# Patient Record
Sex: Female | Born: 2000 | Hispanic: No | Marital: Married | Smoking: Current every day smoker
Health system: Southern US, Community
[De-identification: ages and names within clinical notes are randomized; demographics above are authoritative.]

## PROBLEM LIST (undated history)

## (undated) DIAGNOSIS — F909 Attention-deficit hyperactivity disorder, unspecified type: Secondary | ICD-10-CM

## (undated) DIAGNOSIS — R569 Unspecified convulsions: Secondary | ICD-10-CM

## (undated) DIAGNOSIS — F319 Bipolar disorder, unspecified: Secondary | ICD-10-CM

---
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
## 2021-10-19 ENCOUNTER — Other Ambulatory Visit: Payer: Self-pay

## 2021-10-19 ENCOUNTER — Emergency Department (HOSPITAL_COMMUNITY)
Admission: EM | Admit: 2021-10-19 | Discharge: 2021-10-19 | Attending: Emergency Medicine | Admitting: Emergency Medicine

## 2021-10-19 ENCOUNTER — Emergency Department (HOSPITAL_COMMUNITY)

## 2021-10-19 ENCOUNTER — Encounter (HOSPITAL_COMMUNITY): Payer: Self-pay

## 2021-10-19 DIAGNOSIS — Z91040 Latex allergy status: Secondary | ICD-10-CM | POA: Insufficient documentation

## 2021-10-19 DIAGNOSIS — R569 Unspecified convulsions: Secondary | ICD-10-CM | POA: Diagnosis present

## 2021-10-19 DIAGNOSIS — Z720 Tobacco use: Secondary | ICD-10-CM | POA: Diagnosis not present

## 2021-10-19 LAB — CBC WITH DIFFERENTIAL/PLATELET
Abs Immature Granulocytes: 0.03 10*3/uL (ref 0.00–0.07)
Basophils Absolute: 0 10*3/uL (ref 0.0–0.1)
Basophils Relative: 0 %
Eosinophils Absolute: 0.1 10*3/uL (ref 0.0–0.5)
Eosinophils Relative: 1 %
HCT: 36.5 % (ref 36.0–46.0)
Hemoglobin: 12.1 g/dL (ref 12.0–15.0)
Immature Granulocytes: 0 %
Lymphocytes Relative: 17 %
Lymphs Abs: 1.4 10*3/uL (ref 0.7–4.0)
MCH: 28.9 pg (ref 26.0–34.0)
MCHC: 33.2 g/dL (ref 30.0–36.0)
MCV: 87.1 fL (ref 80.0–100.0)
Monocytes Absolute: 0.5 10*3/uL (ref 0.1–1.0)
Monocytes Relative: 6 %
Neutro Abs: 6.2 10*3/uL (ref 1.7–7.7)
Neutrophils Relative %: 76 %
Platelets: 329 10*3/uL (ref 150–400)
RBC: 4.19 MIL/uL (ref 3.87–5.11)
RDW: 14.3 % (ref 11.5–15.5)
WBC: 8.3 10*3/uL (ref 4.0–10.5)
nRBC: 0 % (ref 0.0–0.2)

## 2021-10-19 LAB — URINALYSIS, ROUTINE W REFLEX MICROSCOPIC
Bilirubin Urine: NEGATIVE
Glucose, UA: NEGATIVE mg/dL
Hgb urine dipstick: NEGATIVE
Ketones, ur: 5 mg/dL — AB
Nitrite: NEGATIVE
Protein, ur: NEGATIVE mg/dL
Specific Gravity, Urine: 1.006 (ref 1.005–1.030)
pH: 7 (ref 5.0–8.0)

## 2021-10-19 LAB — BASIC METABOLIC PANEL
Anion gap: 6 (ref 5–15)
BUN: 9 mg/dL (ref 6–20)
CO2: 24 mmol/L (ref 22–32)
Calcium: 9 mg/dL (ref 8.9–10.3)
Chloride: 106 mmol/L (ref 98–111)
Creatinine, Ser: 0.7 mg/dL (ref 0.44–1.00)
GFR, Estimated: >60 mL/min (ref >60)
Glucose, Bld: 88 mg/dL (ref 70–99)
Potassium: 3.7 mmol/L (ref 3.5–5.1)
Sodium: 136 mmol/L (ref 135–145)

## 2021-10-19 LAB — RAPID URINE DRUG SCREEN, HOSP PERFORMED
Amphetamines: NOT DETECTED
Barbiturates: NOT DETECTED
Benzodiazepines: POSITIVE — AB
Cocaine: NOT DETECTED
Opiates: NOT DETECTED
Tetrahydrocannabinol: NOT DETECTED

## 2021-10-19 LAB — ETHANOL: Alcohol, Ethyl (B): <10 mg/dL (ref <10)

## 2021-10-19 LAB — HCG, SERUM, QUALITATIVE: Preg, Serum: NEGATIVE

## 2021-10-19 LAB — SALICYLATE LEVEL: Salicylate Lvl: <7 mg/dL — ABNORMAL LOW (ref 7.0–30.0)

## 2021-10-19 LAB — ACETAMINOPHEN LEVEL: Acetaminophen (Tylenol), Serum: <10 ug/mL — ABNORMAL LOW (ref 10–30)

## 2021-10-19 MED ORDER — SODIUM CHLORIDE 0.9 % IV BOLUS
1000.0000 mL | Freq: Once | INTRAVENOUS | Status: AC
  Administered 2021-10-19: 1000 mL via INTRAVENOUS

## 2021-10-19 MED ORDER — LEVETIRACETAM IN NACL 1000 MG/100ML IV SOLN
1000.0000 mg | Freq: Once | INTRAVENOUS | Status: AC
  Administered 2021-10-19: 1000 mg via INTRAVENOUS
  Filled 2021-10-19: qty 100

## 2021-10-19 NOTE — ED Provider Notes (Signed)
[facility] EMERGENCY DEPARTMENT Provider Note   CSN: [id number] Arrival date & time: 10/19/21  1832     History  Chief Complaint  Patient presents with   Seizures    Brandi Sanchez is a 21 y.o. female history of juvenile myoclonic epilepsy, bipolar, tobacco use, marijuana use here for evaluation of seizure-like activity.  Patient brought in from jail where she has been incarcerated for quite some time.  States she had 2 seizures today.  She states she felt the first 1 "come on" and she sat herself on the ground.  States she has had urinary incontinence, did not bite her tongue.  Sudden onset thunderclap headache.  EMS witnessed seizure on their arrival lasted just under a minute.  Was given Versed with resolved.  She denies any recent head injury or trauma.  Has been feeling otherwise well.  No fever, headache, numbness, weakness, chest pain, shortness of breath, cough, abdominal pain.  Patient states she supposed to be taking 1000 mg Keppra 3 times daily however has only been given 500 since her incarceration.  She does however states has been many years since she has been evaluated by neurology so she is not quite sure what that she should be on.  She told myself as well as nursing staff different doses on when she gets while she is incarcerated.  Sheriff at bedside is unaware. No nursing notes from jail with meds listed.  Last seizure > 2 years ago  HPI     Home Medications Prior to Admission medications   Medication Sig Start Date End Date Taking? Authorizing Provider  cephALEXin (KEFLEX) 500 MG capsule Take 500 mg by mouth 2 (two) times daily. 10/13/21  Yes [provider]  GOODSENSE IBUPROFEN 200 MG tablet Take 200 mg by mouth. 10/13/21  Yes [provider]  levETIRAcetam (KEPPRA) 1000 MG tablet Take 1 tablet (1,000 mg total) by mouth every 12 (twelve) hours. 02/26/18  Yes Tat, Onalee Hua, MD  levETIRAcetam (KEPPRA) 750 MG tablet Take 750 mg by mouth 2 (two) times  daily. 10/13/21   [provider]      Allergies    Latex and Milk-related compounds    Review of Systems   Review of Systems  Constitutional: Negative.   HENT: Negative.    Respiratory: Negative.    Cardiovascular: Negative.   Gastrointestinal: Negative.   Genitourinary: Negative.   Musculoskeletal: Negative.   Skin: Negative.   Neurological: Negative.   All other systems reviewed and are negative.   Physical Exam Updated Vital Signs BP 109/71   Pulse 62   Temp 98.4 F (36.9 C)   Resp 20   Ht 5\' 2"  (1.575 m)   Wt 67 kg   SpO2 100%   BMI 27.02 kg/m  Physical Exam Vitals and nursing note reviewed.  Constitutional:      General: She is not in acute distress.    Appearance: She is well-developed. She is not ill-appearing, toxic-appearing or diaphoretic.  HENT:     Head: Normocephalic and atraumatic.     Nose: Nose normal.     Mouth/Throat:     Mouth: Mucous membranes are moist.  Eyes:     Pupils: Pupils are equal, round, and reactive to light.  Cardiovascular:     Rate and Rhythm: Normal rate.     Pulses: Normal pulses.     Heart sounds: Normal heart sounds.  Pulmonary:     Effort: Pulmonary effort is normal. No respiratory distress.  Breath sounds: Normal breath sounds.  Abdominal:     General: Bowel sounds are normal. There is no distension.     Palpations: Abdomen is soft.  Musculoskeletal:        General: No swelling, tenderness, deformity or signs of injury. Normal range of motion.     Cervical back: Normal range of motion.     Right lower leg: No edema.     Left lower leg: No edema.  Skin:    General: Skin is warm and dry.     Capillary Refill: Capillary refill takes less than 2 seconds.  Neurological:     General: No focal deficit present.     Mental Status: She is alert and oriented to person, place, and time.     Cranial Nerves: No cranial nerve deficit.     Sensory: No sensory deficit.     Motor: No weakness.     Coordination:  Coordination normal.     Gait: Gait normal.  Psychiatric:        Mood and Affect: Mood normal.     ED Results / Procedures / Treatments   Labs (all labs ordered are listed, but only abnormal results are displayed) Labs Reviewed  URINALYSIS, ROUTINE W REFLEX MICROSCOPIC - Abnormal; Notable for the following components:      Result Value   Color, Urine STRAW (*)    Ketones, ur 5 (*)    Leukocytes,Ua SMALL (*)    Bacteria, UA RARE (*)    All other components within normal limits  RAPID URINE DRUG SCREEN, HOSP PERFORMED - Abnormal; Notable for the following components:   Benzodiazepines POSITIVE (*)    All other components within normal limits  SALICYLATE LEVEL - Abnormal; Notable for the following components:   Salicylate Lvl <7.0 (*)    All other components within normal limits  ACETAMINOPHEN LEVEL - Abnormal; Notable for the following components:   Acetaminophen (Tylenol), Serum <10 (*)    All other components within normal limits  CBC WITH DIFFERENTIAL/PLATELET  BASIC METABOLIC PANEL  HCG, SERUM, QUALITATIVE  ETHANOL    EKG None  Radiology DG Chest Portable 1 View  Result Date: 10/19/2021 CLINICAL DATA:  Fall, seizure EXAM: PORTABLE CHEST 1 VIEW COMPARISON:  02/25/2018 FINDINGS: The heart size and mediastinal contours are within normal limits. Both lungs are clear. The visualized skeletal structures are unremarkable. IMPRESSION: No active disease. Electronically Signed   By: Jasmine Pang M.D.   On: 10/19/2021 19:20    Procedures Procedures    Medications Ordered in ED Medications  levETIRAcetam (KEPPRA) IVPB 1000 mg/100 mL premix (0 mg Intravenous Stopped 10/19/21 2000)  sodium chloride 0.9 % bolus 1,000 mL (0 mLs Intravenous Stopped 10/19/21 2045)    ED Course/ Medical Decision Making/ A&P    21 year old known history of epilepsy here for evaluation of witnessed seizure-like activity while incarcerated.  Sounds like there has been some confusion as to how much  Keppra she should be on she states has been many years since she has been evaluated by neurology.  No tongue injury.  No head injury.  States she felt like "coming on" and sat her self on the ground.  Is nonfocal neuro exam without deficits.  No postictal period here in the ED.  Feeling otherwise well.  Will give dose of Keppra, check labs and reassess  Labs and imaging personally viewed and interpreted:  Xray chest without significant abnormality EKG without significant abnormality Chest X-ray without cardiomegaly, pulm edema, pneumothorax CBC  without leukocytosis BMP without significant abnormality UA neg for infection UDS positive for benzo given by EMS PTA Ethanol, salicylate, acetaminophen  Patient reassessed.  No seizure-like activity here in the emergency department.  Will p.o. challenge.  Patient reassessed.  Tolerating p.o. intake.  I discussed the sheriff's officer that patient likely needs to be evaluated by neurology in the near future to determine her actual appropriate dosing.  We will have her follow-up outpatient.  Return for new or worsening symptoms.  No further seizure-like activity here in the emergency department.  Low suspicion for acute intracranial abnormality, infectious process, electrolyte abnormality, withdrawal as cause of her seizure-like activity                          Medical Decision Making Amount and/or Complexity of Data Reviewed Independent Historian: EMS    Details: Sheriff at bedside External Data Reviewed: labs, radiology, ECG and notes. Labs: ordered. Decision-making details documented in ED Course. Radiology: ordered and independent interpretation performed. Decision-making details documented in ED Course. ECG/medicine tests: ordered and independent interpretation performed. Decision-making details documented in ED Course.  Risk OTC drugs. Prescription drug management. Parenteral controlled substances. Decision regarding  hospitalization. Diagnosis or treatment significantly limited by social determinants of health.         Final Clinical Impression(s) / ED Diagnoses Final diagnoses:  Seizure [facility])    Rx / DC Orders ED Discharge Orders     None         Orman Matsumura A, PA-C 10/19/21 2253    Godfrey Pick, MD 10/20/21 0139

## 2021-10-19 NOTE — ED Triage Notes (Addendum)
Per EMS patient had 3 witnessed seizures at the jail that lasted approximately 30 seconds with convulsions. Per Ems witnessed seizure with them that lasted almost a minute. Patient given 2.5mg  versed IM. Cbg 140. Patient alert & oriented X3. Hx of seizures and patient normally takes 1000mg  keppra TID and has only been giving 500mg  TID since incarceration.

## 2021-10-19 NOTE — Discharge Instructions (Signed)
Make sure to follow-up with neurology to determine the dosing of Keppra you should be on.  Return for new or worsening symptoms

## 2021-11-12 ENCOUNTER — Emergency Department (HOSPITAL_COMMUNITY)

## 2021-11-12 ENCOUNTER — Inpatient Hospital Stay (HOSPITAL_COMMUNITY)
Admission: EM | Admit: 2021-11-12 | Discharge: 2021-11-14 | DRG: 101 | Disposition: A | Discharge status: Home / Self Care | Attending: Family Medicine | Admitting: Family Medicine

## 2021-11-12 ENCOUNTER — Encounter (HOSPITAL_COMMUNITY): Payer: Self-pay | Admitting: Emergency Medicine

## 2021-11-12 ENCOUNTER — Other Ambulatory Visit: Payer: Self-pay

## 2021-11-12 DIAGNOSIS — Z820 Family history of epilepsy and other diseases of the nervous system: Secondary | ICD-10-CM

## 2021-11-12 DIAGNOSIS — G40909 Epilepsy, unspecified, not intractable, without status epilepticus: Secondary | ICD-10-CM

## 2021-11-12 DIAGNOSIS — R112 Nausea with vomiting, unspecified: Secondary | ICD-10-CM | POA: Diagnosis not present

## 2021-11-12 DIAGNOSIS — F17210 Nicotine dependence, cigarettes, uncomplicated: Secondary | ICD-10-CM | POA: Diagnosis present

## 2021-11-12 DIAGNOSIS — R569 Unspecified convulsions: Secondary | ICD-10-CM | POA: Diagnosis not present

## 2021-11-12 DIAGNOSIS — F909 Attention-deficit hyperactivity disorder, unspecified type: Secondary | ICD-10-CM | POA: Diagnosis present

## 2021-11-12 DIAGNOSIS — F319 Bipolar disorder, unspecified: Secondary | ICD-10-CM | POA: Diagnosis present

## 2021-11-12 DIAGNOSIS — Z91040 Latex allergy status: Secondary | ICD-10-CM

## 2021-11-12 DIAGNOSIS — Z79899 Other long term (current) drug therapy: Secondary | ICD-10-CM

## 2021-11-12 DIAGNOSIS — Z6830 Body mass index (BMI) 30.0-30.9, adult: Secondary | ICD-10-CM

## 2021-11-12 DIAGNOSIS — Z791 Long term (current) use of non-steroidal anti-inflammatories (NSAID): Secondary | ICD-10-CM

## 2021-11-12 DIAGNOSIS — G40B09 Juvenile myoclonic epilepsy, not intractable, without status epilepticus: Principal | ICD-10-CM | POA: Diagnosis present

## 2021-11-12 DIAGNOSIS — Z91011 Allergy to milk products: Secondary | ICD-10-CM

## 2021-11-12 DIAGNOSIS — E669 Obesity, unspecified: Secondary | ICD-10-CM | POA: Diagnosis present

## 2021-11-12 LAB — CBC WITH DIFFERENTIAL/PLATELET
Abs Immature Granulocytes: 0.02 10*3/uL (ref 0.00–0.07)
Basophils Absolute: 0 10*3/uL (ref 0.0–0.1)
Basophils Relative: 1 %
Eosinophils Absolute: 0.1 10*3/uL (ref 0.0–0.5)
Eosinophils Relative: 1 %
HCT: 38.9 % (ref 36.0–46.0)
Hemoglobin: 12.8 g/dL (ref 12.0–15.0)
Immature Granulocytes: 0 %
Lymphocytes Relative: 22 %
Lymphs Abs: 1.8 10*3/uL (ref 0.7–4.0)
MCH: 29.4 pg (ref 26.0–34.0)
MCHC: 32.9 g/dL (ref 30.0–36.0)
MCV: 89.4 fL (ref 80.0–100.0)
Monocytes Absolute: 0.6 10*3/uL (ref 0.1–1.0)
Monocytes Relative: 7 %
Neutro Abs: 5.4 10*3/uL (ref 1.7–7.7)
Neutrophils Relative %: 69 %
Platelets: 264 10*3/uL (ref 150–400)
RBC: 4.35 MIL/uL (ref 3.87–5.11)
RDW: 13.8 % (ref 11.5–15.5)
WBC: 7.9 10*3/uL (ref 4.0–10.5)
nRBC: 0 % (ref 0.0–0.2)

## 2021-11-12 LAB — URINALYSIS, ROUTINE W REFLEX MICROSCOPIC
Bilirubin Urine: NEGATIVE
Glucose, UA: NEGATIVE mg/dL
Hgb urine dipstick: NEGATIVE
Ketones, ur: NEGATIVE mg/dL
Nitrite: NEGATIVE
Protein, ur: NEGATIVE mg/dL
Specific Gravity, Urine: 1.019 (ref 1.005–1.030)
pH: 6 (ref 5.0–8.0)

## 2021-11-12 LAB — COMPREHENSIVE METABOLIC PANEL
ALT: 10 U/L (ref 0–44)
AST: 14 U/L — ABNORMAL LOW (ref 15–41)
Albumin: 4 g/dL (ref 3.5–5.0)
Alkaline Phosphatase: 83 U/L (ref 38–126)
Anion gap: 6 (ref 5–15)
BUN: 11 mg/dL (ref 6–20)
CO2: 22 mmol/L (ref 22–32)
Calcium: 8.9 mg/dL (ref 8.9–10.3)
Chloride: 111 mmol/L (ref 98–111)
Creatinine, Ser: 0.81 mg/dL (ref 0.44–1.00)
GFR, Estimated: >60 mL/min (ref >60)
Glucose, Bld: 95 mg/dL (ref 70–99)
Potassium: 4 mmol/L (ref 3.5–5.1)
Sodium: 139 mmol/L (ref 135–145)
Total Bilirubin: 0.4 mg/dL (ref 0.3–1.2)
Total Protein: 7.1 g/dL (ref 6.5–8.1)

## 2021-11-12 LAB — CBG MONITORING, ED: Glucose-Capillary: 82 mg/dL (ref 70–99)

## 2021-11-12 LAB — RAPID URINE DRUG SCREEN, HOSP PERFORMED
Amphetamines: NOT DETECTED
Barbiturates: NOT DETECTED
Benzodiazepines: NOT DETECTED
Cocaine: NOT DETECTED
Opiates: NOT DETECTED
Tetrahydrocannabinol: NOT DETECTED

## 2021-11-12 MED ORDER — LEVETIRACETAM IN NACL 1000 MG/100ML IV SOLN
1000.0000 mg | INTRAVENOUS | Status: AC
  Administered 2021-11-12 (×2): 1000 mg via INTRAVENOUS
  Filled 2021-11-12: qty 100

## 2021-11-12 MED ORDER — LEVETIRACETAM 750 MG PO TABS: 1500.0000 mg | ORAL_TABLET | Freq: Two times a day (BID) | ORAL | 0 refills | Status: DC

## 2021-11-12 MED ORDER — LORAZEPAM 2 MG/ML IJ SOLN
2.0000 mg | Freq: Once | INTRAMUSCULAR | Status: AC | PRN
  Administered 2021-11-12: 2 mg via INTRAVENOUS
  Filled 2021-11-12: qty 1

## 2021-11-12 MED ORDER — LEVETIRACETAM IN NACL 1000 MG/100ML IV SOLN
1000.0000 mg | Freq: Once | INTRAVENOUS | Status: AC
  Administered 2021-11-12: 1000 mg via INTRAVENOUS

## 2021-11-12 NOTE — ED Notes (Signed)
Patient's eyes deviated to left and patient had seizure. EDPa in room. Seizure pads in place. Ativan given at [place]. Seizure lasted 2 minutes.

## 2021-11-12 NOTE — ED Provider Notes (Signed)
[facility] EMERGENCY DEPARTMENT Provider Note   CSN: [id number] Arrival date & time: 11/12/21  1848     History  Chief Complaint  Patient presents with   Seizures    Brandi Sanchez is a 21 y.o. female.  Patient presents to the hospital via EMS accompanied by law enforcement due to seizure activity.  [place] corrections nurse reports that patient has had 4 seizures lasting for minutes each.  EMS reports the patient had 1 seizure during transport which lasted for approximately 15 seconds.  The patient was reportedly postictal but quickly returned to baseline.  No medication was administered prior to arrival at the hospital.  The patient states she knows she had seizures today.  She states they have been working on adjusting her Keppra levels to try to find a therapeutic dose.  She states she thought they had figured out a correct dose since her last hospital visit which was on October 11 of this year.  The patient at this time denies any recent head injury or trauma, fever, shortness of breath, chest pain, headache, numbness, weakness, abdominal pain.  She states that she has been taking her Keppra as prescribed while incarcerated.  She denies incontinence and denies any mouth injury after the seizure-like activity today.  Prior to lab work being started the patient had tonic-clonic seizure activity which lasted for approximately 2 minutes with a postictal period following.  2 mg Ativan was administered.  Past medical history significant for juvenile myoclonic epilepsy, bipolar 1 disorder, ADHD  HPI     Home Medications Prior to Admission medications   Medication Sig Start Date End Date Taking? Authorizing Provider  cephALEXin (KEFLEX) 500 MG capsule Take 500 mg by mouth 2 (two) times daily. 10/13/21   [provider]  GOODSENSE IBUPROFEN 200 MG tablet Take 200 mg by mouth. 10/13/21   [provider]  levETIRAcetam (KEPPRA) 1000 MG tablet Take 1 tablet (1,000 mg total) by  mouth every 12 (twelve) hours. 02/26/18   Catarina Hartshorn, MD  levETIRAcetam (KEPPRA) 750 MG tablet Take 750 mg by mouth 2 (two) times daily. 10/13/21   [provider]      Allergies    Latex and Milk-related compounds    Review of Systems   Review of Systems  Constitutional:  Negative for fever.  Respiratory:  Negative for shortness of breath.   Cardiovascular:  Negative for chest pain.  Gastrointestinal:  Negative for abdominal pain.  Neurological:  Positive for seizures.    Physical Exam Updated Vital Signs BP 104/65   Pulse 67   Temp 98.2 F (36.8 C) (Oral)   Resp 15   Ht 5' 2.5" (1.588 m)   Wt 76.7 kg   LMP 10/26/2021   SpO2 97%   BMI 30.42 kg/m  Physical Exam Vitals and nursing note reviewed.  Constitutional:      General: She is not in acute distress.    Appearance: She is well-developed.  HENT:     Head: Normocephalic and atraumatic.     Mouth/Throat:     Mouth: Mucous membranes are moist.  Eyes:     Conjunctiva/sclera: Conjunctivae normal.  Cardiovascular:     Rate and Rhythm: Normal rate and regular rhythm.     Heart sounds: No murmur heard. Pulmonary:     Effort: Pulmonary effort is normal. No respiratory distress.     Breath sounds: Normal breath sounds.  Abdominal:     Palpations: Abdomen is soft.     Tenderness: There  is no abdominal tenderness.  Musculoskeletal:        General: No swelling.     Cervical back: Neck supple.  Skin:    General: Skin is warm and dry.     Capillary Refill: Capillary refill takes less than 2 seconds.  Neurological:     Mental Status: She is alert.  Psychiatric:        Mood and Affect: Mood normal.     ED Results / Procedures / Treatments   Labs (all labs ordered are listed, but only abnormal results are displayed) Labs Reviewed  COMPREHENSIVE METABOLIC PANEL - Abnormal; Notable for the following components:      Result Value   AST 14 (*)    All other components within normal limits  URINALYSIS, ROUTINE  W REFLEX MICROSCOPIC - Abnormal; Notable for the following components:   APPearance HAZY (*)    Leukocytes,Ua SMALL (*)    Bacteria, UA FEW (*)    All other components within normal limits  CBC WITH DIFFERENTIAL/PLATELET  RAPID URINE DRUG SCREEN, HOSP PERFORMED  LEVETIRACETAM LEVEL  CBG MONITORING, ED  POC URINE PREG, ED    EKG None  Radiology CT Head Wo Contrast  Result Date: 11/12/2021 CLINICAL DATA:  Recurrent seizure EXAM: CT HEAD WITHOUT CONTRAST TECHNIQUE: Contiguous axial images were obtained from the base of the skull through the vertex without intravenous contrast. RADIATION DOSE REDUCTION: This exam was performed according to the departmental dose-optimization program which includes automated exposure control, adjustment of the mA and/or kV according to patient size and/or use of iterative reconstruction technique. COMPARISON:  09/06/2019 FINDINGS: Brain: No evidence of acute infarction, hemorrhage, hydrocephalus, extra-axial collection or mass lesion/mass effect. Vascular: No hyperdense vessel or unexpected calcification. Skull: Normal. Negative for fracture or focal lesion. Sinuses/Orbits: The visualized paranasal sinuses are essentially clear. The mastoid air cells are unopacified. Other: None. IMPRESSION: Normal head CT. Electronically Signed   By: Julian Hy M.D.   On: 11/12/2021 19:45    Procedures Procedures    Medications Ordered in ED Medications  levETIRAcetam (KEPPRA) IVPB 1000 mg/100 mL premix (0 mg Intravenous Stopped 11/12/21 2024)  LORazepam (ATIVAN) injection 2 mg (2 mg Intravenous Given 11/12/21 1920)  levETIRAcetam (KEPPRA) IVPB 1000 mg/100 mL premix (0 mg Intravenous Stopped 11/12/21 2136)    ED Course/ Medical Decision Making/ A&P                           Medical Decision Making Amount and/or Complexity of Data Reviewed Labs: ordered. Radiology: ordered.  Risk Prescription drug management.   This patient presents to the ED for concern of  seizures, this involves an extensive number of treatment options, and is a complaint that carries with it a high risk of complications and morbidity.  The differential diagnosis includes status epilepticus, infection, intracranial abnormality, and others   Co morbidities that complicate the patient evaluation  History of seizure disorder   Additional history obtained:  Additional history obtained from correction facility nurse and EMS External records from outside source obtained and reviewed including ED charts from October of this year for seizure-like activity   Lab Tests:  I Ordered, and personally interpreted labs.  The pertinent results include: Unremarkable CMP, CBC, UA, negative UDS, CBG 82, Keppra level pending   Imaging Studies ordered:  I ordered imaging studies including CT head I independently visualized and interpreted imaging which showed no acute findings I agree with the radiologist interpretation  Cardiac Monitoring: / EKG:  The patient was maintained on a cardiac monitor.  I personally viewed and interpreted the cardiac monitored which showed an underlying rhythm of: Sinus rhythm   Consultations Obtained:  I requested consultation with the neurologist, Dr.Arora and discussed lab and imaging findings as well as pertinent plan - they recommend: Observation for 5 to 6 hours.  If further seizure activity is noticed, admitted to [facility].  If patient returns to baseline may discharge home on 1500 Keppra twice daily.  May also consider adding a 1500 mg one-time dose of Depakote   Problem List / ED Course / Critical interventions / Medication management   I ordered medication including Ativan and Keppra for seizures Reevaluation of the patient after these medicines showed that the patient improved I have reviewed the patients home medicines and have made adjustments as needed   Social Determinants of Health:  Patient is currently incarcerated   Test /  Admission - Considered:  The patient has had multiple seizures today.  One was witnessed while here in the hospital.  After discussion with neurology, plan to observe patient for 6 hours.  Patient remained seizure-free patient may discharge back and along for cement custody.  Patient's Keppra will be increased to 1500 mg twice daily.  If patient has additional seizure plan to have patient admitted to [facility] for neurology evaluation.Patient care being transferred to Dr. Preston Fleeting at shift handoff        Final Clinical Impression(s) / ED Diagnoses Final diagnoses:  Seizure [facility])    Rx / DC Orders ED Discharge Orders     None         Pamala Duffel 11/12/21 2235    Bethann Berkshire, MD 11/14/21 1044

## 2021-11-12 NOTE — ED Provider Notes (Signed)
Care assumed from Brandi Sanchez, [place], patient with seizure history and multiple seizures tonight treated with loading dose of levetiracetam.  Needs to be observed in the emergency department.  If no further seizures by 1 AM, can be discharged with adjustments in levetiracetam dose.  Patient had another seizure.  I have ordered a dose of lorazepam.  She will need to be admitted for continuous EEG monitoring.  Case is discussed with Dr. Josephine Cables of [facility] hospitalists, who agrees to admit the patient.  She will need to be transferred to [facility].   Delora Fuel, MD 31/12/16 [phone]

## 2021-11-12 NOTE — ED Triage Notes (Signed)
Patient brought in via EMS from [facility]. Patient alert and oriented. 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 Patient brought in for seizures. Patient has hx and takes Keppra. Per [facility] Nurse patient has had seizure x4 lasting approx 4 minutes each. Patient had x1 seizure in route in which paramedic states lasted 15 secons. Patient postictal after but quickly back to baseline. IV placed. No medication given in route.

## 2021-11-12 NOTE — Discharge Instructions (Signed)
You were seen today after repeated seizures.  Your Keppra has now been increased to 1500 mg twice daily.  You also need to follow-up with neurology.  I have listed a local neurologist contact information above.  If you continue to experience seizures you to be reevaluated at the emergency department

## 2021-11-13 ENCOUNTER — Inpatient Hospital Stay (HOSPITAL_COMMUNITY)

## 2021-11-13 DIAGNOSIS — G40909 Epilepsy, unspecified, not intractable, without status epilepticus: Secondary | ICD-10-CM

## 2021-11-13 DIAGNOSIS — Z91011 Allergy to milk products: Secondary | ICD-10-CM | POA: Diagnosis not present

## 2021-11-13 DIAGNOSIS — R112 Nausea with vomiting, unspecified: Secondary | ICD-10-CM | POA: Diagnosis not present

## 2021-11-13 DIAGNOSIS — E669 Obesity, unspecified: Secondary | ICD-10-CM | POA: Insufficient documentation

## 2021-11-13 DIAGNOSIS — G40B19 Juvenile myoclonic epilepsy, intractable, without status epilepticus: Secondary | ICD-10-CM

## 2021-11-13 DIAGNOSIS — Z79899 Other long term (current) drug therapy: Secondary | ICD-10-CM | POA: Diagnosis not present

## 2021-11-13 DIAGNOSIS — Z91040 Latex allergy status: Secondary | ICD-10-CM | POA: Diagnosis not present

## 2021-11-13 DIAGNOSIS — F909 Attention-deficit hyperactivity disorder, unspecified type: Secondary | ICD-10-CM | POA: Diagnosis present

## 2021-11-13 DIAGNOSIS — G40B09 Juvenile myoclonic epilepsy, not intractable, without status epilepticus: Secondary | ICD-10-CM | POA: Diagnosis present

## 2021-11-13 DIAGNOSIS — Z820 Family history of epilepsy and other diseases of the nervous system: Secondary | ICD-10-CM | POA: Diagnosis not present

## 2021-11-13 DIAGNOSIS — Z791 Long term (current) use of non-steroidal anti-inflammatories (NSAID): Secondary | ICD-10-CM | POA: Diagnosis not present

## 2021-11-13 DIAGNOSIS — R569 Unspecified convulsions: Secondary | ICD-10-CM | POA: Diagnosis present

## 2021-11-13 DIAGNOSIS — F319 Bipolar disorder, unspecified: Secondary | ICD-10-CM | POA: Diagnosis present

## 2021-11-13 DIAGNOSIS — F17210 Nicotine dependence, cigarettes, uncomplicated: Secondary | ICD-10-CM | POA: Diagnosis present

## 2021-11-13 DIAGNOSIS — Z6830 Body mass index (BMI) 30.0-30.9, adult: Secondary | ICD-10-CM | POA: Diagnosis not present

## 2021-11-13 LAB — CBC
HCT: 35.6 % — ABNORMAL LOW (ref 36.0–46.0)
Hemoglobin: 11.6 g/dL — ABNORMAL LOW (ref 12.0–15.0)
MCH: 29.1 pg (ref 26.0–34.0)
MCHC: 32.6 g/dL (ref 30.0–36.0)
MCV: 89.4 fL (ref 80.0–100.0)
Platelets: 217 10*3/uL (ref 150–400)
RBC: 3.98 MIL/uL (ref 3.87–5.11)
RDW: 13.9 % (ref 11.5–15.5)
WBC: 6 10*3/uL (ref 4.0–10.5)
nRBC: 0 % (ref 0.0–0.2)

## 2021-11-13 LAB — MAGNESIUM: Magnesium: 2.1 mg/dL (ref 1.7–2.4)

## 2021-11-13 LAB — COMPREHENSIVE METABOLIC PANEL
ALT: 10 U/L (ref 0–44)
AST: 12 U/L — ABNORMAL LOW (ref 15–41)
Albumin: 3.3 g/dL — ABNORMAL LOW (ref 3.5–5.0)
Alkaline Phosphatase: 72 U/L (ref 38–126)
Anion gap: 6 (ref 5–15)
BUN: 11 mg/dL (ref 6–20)
CO2: 25 mmol/L (ref 22–32)
Calcium: 8.6 mg/dL — ABNORMAL LOW (ref 8.9–10.3)
Chloride: 109 mmol/L (ref 98–111)
Creatinine, Ser: 0.83 mg/dL (ref 0.44–1.00)
GFR, Estimated: >60 mL/min (ref >60)
Glucose, Bld: 95 mg/dL (ref 70–99)
Potassium: 3.6 mmol/L (ref 3.5–5.1)
Sodium: 140 mmol/L (ref 135–145)
Total Bilirubin: 0.7 mg/dL (ref 0.3–1.2)
Total Protein: 5.9 g/dL — ABNORMAL LOW (ref 6.5–8.1)

## 2021-11-13 LAB — AMMONIA: Ammonia: 20 umol/L (ref 9–35)

## 2021-11-13 LAB — POC URINE PREG, ED: Preg Test, Ur: NEGATIVE

## 2021-11-13 LAB — PHOSPHORUS: Phosphorus: 4.1 mg/dL (ref 2.5–4.6)

## 2021-11-13 LAB — APTT: aPTT: 28 seconds (ref 24–36)

## 2021-11-13 LAB — HIV ANTIBODY (ROUTINE TESTING W REFLEX): HIV Screen 4th Generation wRfx: NONREACTIVE

## 2021-11-13 MED ORDER — DEXTROSE-NACL 5-0.45 % IV SOLN: INTRAVENOUS | Status: DC

## 2021-11-13 MED ORDER — LORAZEPAM 2 MG/ML IJ SOLN
1.0000 mg | Freq: Once | INTRAMUSCULAR | Status: AC
  Administered 2021-11-13: 1 mg via INTRAVENOUS

## 2021-11-13 MED ORDER — LORAZEPAM 2 MG/ML IJ SOLN
2.0000 mg | INTRAMUSCULAR | Status: DC | PRN
  Administered 2021-11-13: 1 mg via INTRAVENOUS
  Administered 2021-11-13: 2 mg via INTRAVENOUS
  Filled 2021-11-13: qty 1

## 2021-11-13 MED ORDER — LORAZEPAM 2 MG/ML IJ SOLN
1.0000 mg | Freq: Once | INTRAMUSCULAR | Status: AC
  Administered 2021-11-13: 1 mg via INTRAVENOUS
  Filled 2021-11-13: qty 1

## 2021-11-13 MED ORDER — LORAZEPAM 2 MG/ML IJ SOLN
1.0000 mg | Freq: Once | INTRAMUSCULAR | Status: AC
  Filled 2021-11-13: qty 1

## 2021-11-13 MED ORDER — ONDANSETRON HCL 4 MG/2ML IJ SOLN
4.0000 mg | Freq: Four times a day (QID) | INTRAMUSCULAR | Status: DC | PRN
  Administered 2021-11-13 (×2): 4 mg via INTRAVENOUS
  Filled 2021-11-13 (×2): qty 2

## 2021-11-13 MED ORDER — LEVETIRACETAM IN NACL 1500 MG/100ML IV SOLN
1500.0000 mg | Freq: Two times a day (BID) | INTRAVENOUS | Status: DC
  Administered 2021-11-13 – 2021-11-14 (×2): 1500 mg via INTRAVENOUS
  Filled 2021-11-13 (×3): qty 100

## 2021-11-13 MED ORDER — LORAZEPAM 2 MG/ML IJ SOLN
INTRAMUSCULAR | Status: AC
  Filled 2021-11-13: qty 1

## 2021-11-13 MED ORDER — SODIUM CHLORIDE 0.9 % IV SOLN: 12.5000 mg | Freq: Four times a day (QID) | INTRAVENOUS | Status: DC | PRN

## 2021-11-13 MED ORDER — ACETAMINOPHEN 650 MG RE SUPP: 650.0000 mg | Freq: Four times a day (QID) | RECTAL | Status: DC | PRN

## 2021-11-13 MED ORDER — CEPHALEXIN 500 MG PO CAPS
500.0000 mg | ORAL_CAPSULE | Freq: Three times a day (TID) | ORAL | Status: DC
  Administered 2021-11-13 (×2): 500 mg via ORAL
  Filled 2021-11-13 (×2): qty 1

## 2021-11-13 MED ORDER — ONDANSETRON HCL 4 MG PO TABS
4.0000 mg | ORAL_TABLET | Freq: Four times a day (QID) | ORAL | Status: DC | PRN
  Administered 2021-11-13: 4 mg via ORAL
  Filled 2021-11-13: qty 1

## 2021-11-13 MED ORDER — LEVETIRACETAM IN NACL 1000 MG/100ML IV SOLN
1000.0000 mg | Freq: Two times a day (BID) | INTRAVENOUS | Status: DC
  Administered 2021-11-13: 1000 mg via INTRAVENOUS
  Filled 2021-11-13: qty 100

## 2021-11-13 MED ORDER — LORAZEPAM 2 MG/ML IJ SOLN
INTRAMUSCULAR | Status: AC
  Administered 2021-11-13: 1 mg via INTRAVENOUS
  Filled 2021-11-13: qty 1

## 2021-11-13 MED ORDER — LORAZEPAM 2 MG/ML IJ SOLN
INTRAMUSCULAR | Status: AC
  Administered 2021-11-13: 1 mg
  Filled 2021-11-13: qty 1

## 2021-11-13 MED ORDER — LEVETIRACETAM IN NACL 1500 MG/100ML IV SOLN
1500.0000 mg | Freq: Once | INTRAVENOUS | Status: AC
  Administered 2021-11-13: 1500 mg via INTRAVENOUS
  Filled 2021-11-13: qty 100

## 2021-11-13 MED ORDER — ACETAMINOPHEN 325 MG PO TABS: 650.0000 mg | ORAL_TABLET | Freq: Four times a day (QID) | ORAL | Status: DC | PRN

## 2021-11-13 MED ORDER — LORAZEPAM 2 MG/ML IJ SOLN: 1.0000 mg | Freq: Four times a day (QID) | INTRAMUSCULAR | Status: DC | PRN

## 2021-11-13 MED ORDER — ENOXAPARIN SODIUM 40 MG/0.4ML IJ SOSY
40.0000 mg | PREFILLED_SYRINGE | INTRAMUSCULAR | Status: DC
  Administered 2021-11-13 – 2021-11-14 (×2): 40 mg via SUBCUTANEOUS
  Filled 2021-11-13 (×2): qty 0.4

## 2021-11-13 NOTE — Progress Notes (Signed)
  Transition of Care [facility]) Screening Note   Patient Details  Name: Brandi Sanchez Date of Birth: 20-Feb-2000   [facility]) CM/SW Contact:    Iona Beard, [place] Phone Number: 11/13/2021, 11:29 AM    Transition of Care Department [facility]) has reviewed patient and no TOC needs have been identified at this time. We will continue to monitor patient advancement through interdisciplinary progression rounds. If new patient transition needs arise, please place a TOC consult.

## 2021-11-13 NOTE — ED Notes (Signed)
Pt had a 121 sec seizure

## 2021-11-13 NOTE — ED Notes (Signed)
Dr Denton Brick called to ED, informed pt had another seizure lasting approximately 1.5 minutes, Ativan 2mg  given. Pt has had total of 4 mg this am. AC notified to see about transfer to Neuro at [facility].

## 2021-11-13 NOTE — Progress Notes (Signed)
LTM EEG hooked up and running - no initial skin breakdown - push button tested - [facility] monitoring.  

## 2021-11-13 NOTE — Progress Notes (Signed)
Deputy notified this nurse that pt was seizing. She was in a position of extreme extension, such that the top of her head appeared to be on the bed and her back was arched. Her eyes were closed. Near the end of the seizure, pt began moving arms. No incontinent episode. At conclusion of seizure, pt was able to answer questions with simple one-word answers, and follow commands. Post-ictal vital signs as follows:BP 99/53, MAP 65, PR 61, O2 100 % on RA. MD on call notified, will continue to monitor pt.

## 2021-11-13 NOTE — H&P (Addendum)
History and Physical    Patient: Brandi Sanchez EPP:[id number] DOB: 06-29-2000 DOA: 11/12/2021 DOS: the patient was seen and examined on 11/13/2021 PCP: System, Provider Not In  Patient coming from:  [place]  Chief Complaint:  Chief Complaint  Patient presents with   Seizures   HPI: Brandi Sanchez is a 21 y.o. female with medical history significant of seizures who presents to the emergency department via EMS accompanied by law enforcement due to breakthrough seizures.  Patient was sleepy after being given Ativan and was unable to provide history, history was obtained from ED physician and ED medical record.  Per report, patient had about [facility] the jail facility, each episode lasting for few minutes and she had an episode en route to the ED which lasted about 15 seconds.  No medication was given prior to arrival to the ED.  Apparently, it appears that patient's physician has been working on correct dose to control patient's seizures.  Patient has been compliant with Keppra.  ED Course:  In the emergency department, she was hemodynamically stable, though BP was soft at 106/6 7.  Prior to starting lab work, patient had a tonic-clonic seizure lasted about 2 minutes followed by a postictal state.  Ativan 2 mg was given.  Work-up in the ED showed normal CBC and BMP, urine drug screen was negative, urine pregnancy test was negative, urinalysis was unimpressive for UTI. CT head without contrast showed normal head CT Keppra loading dose was given.  Lorazepam was given.  Neurologist at [facility] was consulted and it was recommended for patient to be observed in the ED and that if no further seizures by 1 AM, she can be discharged with adjustments in Keppra dose.  Unfortunately, patient had another seizure episode, lorazepam was given, neurologist at [facility] (Dr. Rory Percy) was notified of need for patient to be admitted to [facility] for continuous EEG.  Hospitalist was asked to admit patient and neurology will  follow-up with patient when she arrives at [facility].  Review of Systems: Review of systems as noted in the HPI. All other systems reviewed and are negative.   Past Medical History:  Diagnosis Date   ADHD    Bipolar 1 disorder ([place])    Seizures ([place])    juvenile myoclonic epilepsy   History reviewed. No pertinent surgical history.  Social History:  reports that she has been smoking cigarettes. She has been smoking an average of .25 packs per day. She has never used smokeless tobacco. She reports that she does not currently use alcohol. She reports that she does not use drugs.   Allergies  Allergen Reactions   Latex Itching   Milk-Related Compounds Nausea And Vomiting    History reviewed. No pertinent family history.    Prior to Admission medications   Medication Sig Start Date End Date Taking? Authorizing Provider  cephALEXin (KEFLEX) 500 MG capsule Take 500 mg by mouth 2 (two) times daily. 10/13/21   [provider]  GOODSENSE IBUPROFEN 200 MG tablet Take 200 mg by mouth. 10/13/21   [provider]  levETIRAcetam (KEPPRA) 750 MG tablet Take 2 tablets (1,500 mg total) by mouth 2 (two) times daily. 11/12/21 12/12/21  [name], PA-C    Physical Exam: BP 109/80   Pulse 88   Temp 98.6 F (37 C) (Oral)   Resp 16   Ht 5' 2.5" (1.588 m)   Wt 76.7 kg   LMP 10/26/2021   SpO2 98%   BMI 30.42 kg/m  General: 21 y.o. year-old female well developed well nourished in no acute distress.  Alert and oriented x3. HEENT: NCAT, EOMI Neck: Supple, trachea medial Cardiovascular: Regular rate and rhythm with no rubs or gallops.  No thyromegaly or JVD noted.  No lower extremity edema. 2/4 pulses in all 4 extremities. Respiratory: Clear to auscultation with no wheezes or rales. Good inspiratory effort. Abdomen: Soft, nontender nondistended with normal bowel sounds x4 quadrants. Muskuloskeletal: No cyanosis, clubbing or edema noted bilaterally Neuro: CN II-XII  intact, strength 5/5 x 4, sensation, reflexes intact Skin: No ulcerative lesions noted or rashes Psychiatry: Judgement and insight appear normal. Mood is appropriate for condition and setting          Labs on Admission:  Basic Metabolic Panel: Recent Labs  Lab 11/12/21 1918  NA 139  K 4.0  CL 111  CO2 22  GLUCOSE 95  BUN 11  CREATININE 0.81  CALCIUM 8.9   Liver Function Tests: Recent Labs  Lab 11/12/21 1918  AST 14*  ALT 10  ALKPHOS 83  BILITOT 0.4  PROT 7.1  ALBUMIN 4.0   No results for input(s): "LIPASE", "AMYLASE" in the last 168 hours. No results for input(s): "AMMONIA" in the last 168 hours. CBC: Recent Labs  Lab 11/12/21 1918  WBC 7.9  NEUTROABS 5.4  HGB 12.8  HCT 38.9  MCV 89.4  PLT 264   Cardiac Enzymes: No results for input(s): "CKTOTAL", "CKMB", "CKMBINDEX", "TROPONINI" in the last 168 hours.  BNP (last 3 results) No results for input(s): "BNP" in the last 8760 hours.  ProBNP (last 3 results) No results for input(s): "PROBNP" in the last 8760 hours.  CBG: Recent Labs  Lab 11/12/21 1912  GLUCAP 82    Radiological Exams on Admission: CT Head Wo Contrast  Result Date: 11/12/2021 CLINICAL DATA:  Recurrent seizure EXAM: CT HEAD WITHOUT CONTRAST TECHNIQUE: Contiguous axial images were obtained from the base of the skull through the vertex without intravenous contrast. RADIATION DOSE REDUCTION: This exam was performed according to the departmental dose-optimization program which includes automated exposure control, adjustment of the mA and/or kV according to patient size and/or use of iterative reconstruction technique. COMPARISON:  09/06/2019 FINDINGS: Brain: No evidence of acute infarction, hemorrhage, hydrocephalus, extra-axial collection or mass lesion/mass effect. Vascular: No hyperdense vessel or unexpected calcification. Skull: Normal. Negative for fracture or focal lesion. Sinuses/Orbits: The visualized paranasal sinuses are essentially clear.  The mastoid air cells are unopacified. Other: None. IMPRESSION: Normal head CT. Electronically Signed   By: Julian Hy M.D.   On: 11/12/2021 19:45    EKG: I independently viewed the EKG done and my findings are as followed: Normal sinus rhythm at a rate of 77 bpm  Assessment/Plan Present on Admission: **None**  Principal Problem:   Recurrent seizures (HCC)  Recurrent seizures Loading dose of Keppra and lorazepam was given, but patient still had seizures.  Another dose of lorazepam was given. Neurology was consulted and it was recommended for patient to be admitted to [facility] with plan for neurology team to follow-up on patient.   DVT prophylaxis: Lovenox  Code Status: Full code  Family Communication: None at bedside  Consults: Neurology (by AP EDP)  Family Communication: None at bedside  Severity of Illness: The appropriate patient status for this patient is INPATIENT. Inpatient status is judged to be reasonable and necessary in order to provide the required intensity of service to ensure the patient's safety. The patient's presenting symptoms, physical exam findings, and initial radiographic and  laboratory data in the context of their chronic comorbidities is felt to place them at high risk for further clinical deterioration. Furthermore, it is not anticipated that the patient will be medically stable for discharge from the hospital within 2 midnights of admission.   * I certify that at the point of admission it is my clinical judgment that the patient will require inpatient hospital care spanning beyond 2 midnights from the point of admission due to high intensity of service, high risk for further deterioration and high frequency of surveillance required.*  Author: Bernadette Hoit, DO 11/13/2021 1:26 AM  For on call review [email or web address].

## 2021-11-13 NOTE — ED Notes (Signed)
Pt had 3 min 41 sec seizure

## 2021-11-13 NOTE — Consult Note (Addendum)
Neurology Consultation    Reason for Consult: Seizures  CC: Seizures  HISTORY OF PRESENT ILLNESS   HPI  History is obtained from: Patient  Brandi Sanchez is a 21 y.o. incarcerated female with a past medical history of ADHD, bipolar 1, and juvenile myoclonic epilepsy presenting with breakthrough seizures. She was reported to have 4 seizures at the facility prior to coming to the [facility] ED. There was a short post ictal period after the seizures. She then had at least 5 documented episodes of seizure like activity while in [facility] ED. She was loaded with Keppra and transferred to [facility] for LTM. She reports that she typically takes 1000 mg of Keppra "three times a day" and when I spoke with the nurse at the facility she states she was getting 1000 mg BID. There is also a note stating that the patient was receiving 500mg  TID since she was incarcerated. Our system shows a prescription for 750 mg BID as well. Patient states that she was diagnosed with epilepsy when she was 23 and that she has not been evaluated by neurology in years. She was seen in the ED on 10/19/2021 and prior to that 09/06/2019 for a seizure as well. Her neurologist was Dr. Volanda Napoleon in [place], [place]. She stated that he took her off of her Keppra during a previous ED visit. Records request placed.   Upon entering the room patient has just received IV antiemetic due to an episode of emesis.  Medications:  She received 3000 mg of Keppra on 11/4 and 2500mg  of Keppra on 11/5 at 0800. She has also received a total of 11 mg ativan in the last 13 hours.   The patient denies headache, dizziness, visual changes, problems with swallowing or speech, focal muscle weakness, numbness or tingling of her extremities, abnormal movements, or other focal neurological deficits.   ROS: Full ROS was performed and is negative except as noted in the HPI.  PAST MEDICAL HISTORY    Past Medical History:  Past Medical History:  Diagnosis Date   ADHD     Bipolar 1 disorder ([place])    Seizures ([place])    juvenile myoclonic epilepsy    No family history on file. History reviewed. No pertinent family history.  Allergies:  Allergies  Allergen Reactions   Latex Itching   Milk-Related Compounds Nausea And Vomiting    Social History:   reports that she has been smoking cigarettes. She has been smoking an average of .25 packs per day. She has never used smokeless tobacco. She reports that she does not currently use alcohol. She reports that she does not use drugs.    Medications No medications prior to admission.    EXAMINATION    Current vital signs:    11/13/2021    3:54 PM 11/13/2021    2:02 PM 11/13/2021    1:00 PM  Vitals with BMI  Systolic [id number]  Diastolic 67 61 70  Pulse 80 81 75    Examination:  GENERAL: Drowsy, appears ill.  HEENT: - Normocephalic and atraumatic, dry mm, no lymphadenopathy, no Thyromegally LUNGS - Clear to auscultation bilaterally CV - S1S2 RRR, equal pulses bilaterally. ABDOMEN - Soft, nontender, nondistended with normoactive BS Ext: warm, well perfused, intact peripheral pulses, no pedal edema  NEURO:  Mental Status:Drowsy, oriented x 3. Patient falling asleep during exam due to ativan and antiemetics. She is able to follow commands.  Language: speech is clear Cranial Nerves:  II: PERRL. Visual fields full  to confrontation.  III, IV, VI: EOM in tact. Eyelids elevate symmetrically. Blinks to threat.  V: Sensation intact V1-3 symmetrically  VII: no facial asymmetry   VIII: hearing intact to voice IX, X: Palate elevates symmetrically. Phonation is normal.  QP:[id number] shrug 5/5 and symmetrical  XII: tongue is midline without fasciculations. Motor:  Moves all extremities, effort dependent. Sensation- Intact to light touch bilaterally Coordination: FTN intact bilaterally, no abnormal movements  Gait- deferred  LABS   I have reviewed labs in epic and the results pertinent to this  consultation are:   No results found for: "[facility]" Lab Results  Component Value Date   ALT 10 11/13/2021   AST 12 (L) 11/13/2021   ALKPHOS 72 11/13/2021   BILITOT 0.7 11/13/2021   No results found for: "HGBA1C" Lab Results  Component Value Date   WBC 6.0 11/13/2021   HGB 11.6 (L) 11/13/2021   HCT 35.6 (L) 11/13/2021   MCV 89.4 11/13/2021   PLT 217 11/13/2021   No results found for: "VITAMINB12" No results found for: "FOLATE" Lab Results  Component Value Date   NA 140 11/13/2021   K 3.6 11/13/2021   CL 109 11/13/2021   CO2 25 11/13/2021     DIAGNOSTIC IMAGING/PROCEDURES   I have reviewed the images obtained:, as below    CT-head- normal CT Head  LTM- pending  ASSESSMENT/PLAN    Assessment: 21 y.o. incarcerated female with a past medical history of ADHD, bipolar 1, and juvenile myoclonic epilepsy presenting with breakthrough seizures. She was reported to have 4 seizures at the facility prior to coming to the ED. There was a short post ictal period after the seizures. She then had at least 5 documented episodes of seizure like activity while in the [facility] ED. She was sent to [facility] for LTM EEG.  - Exam is effort dependent and hindered by recent Ativan administration.  - Patient is nauseous and has had multiple episodes of emesis as well.  - LTM is to be connected today.  - May consider adding Depakote if she continues to have seizures.  - Of note, there are conflicting reports as to her prescribed dosing of Keppra. Will continue with the highest of the documented BID dosing regimens of 1000 mg BID.   Impression: Breakthrough seizures   Recommendations: Keppra loading doses given at [facility], now on 1000 mg BID IV Consider Depakote if she continues to have seizures, and if they are verified to be epileptic on LTM EEG LTM ordered Ativan 2mg  for seizures last longer than 5 minutes Discussed [facility] statutes, patients with seizures are not allowed to  drive until they have been seizure-free for six months. Use caution when using heavy equipment or power tools. Avoid working on ladders or at heights. Take showers instead of baths. Ensure the water temperature is not too high on the home water heater. Do not go swimming alone. Do not lock yourself in a room alone (i.e. bathroom). When caring for infants or small children, sit down when holding, feeding, or changing them to minimize risk of injury to the child in the event you have a seizure. Maintain good sleep hygiene. Avoid alcohol.   -- Patient seen and examined by NP/APP with MD.  Janine Ores, DNP, FNP-BC [facility] Pager: [phone]  I have seen and examined the patient. I have formulated the assessment and recommendations. 21 year old incarcerated female with a past medical history of ADHD, bipolar 1, and juvenile myoclonic  epilepsy presenting with breakthrough seizures. She was reported to have 4 seizures at the facility prior to coming to the ED. There was a short post ictal period after the seizures. She then had at least 5 documented episodes of seizure like activity while in the [facility] ED. She was sent to [facility] for LTM EEG.  Follow up exam by Neurology attending after sedation has worn off is nonfocal. Recommendations include LTM EEG and continuation of Keppra at 1000 mg IV BID.  Electronically signed: Dr. Kerney Elbe

## 2021-11-13 NOTE — Progress Notes (Signed)
Pt is being cleaned up, check back in 15 mins

## 2021-11-13 NOTE — ED Notes (Signed)
Pt had 78 sec sz

## 2021-11-13 NOTE — ED Notes (Signed)
Pt with seizure like activity lasting approximately 1.5 minutes. Pt HR elevated to 120-130's. Pt O2 stayed above 95%. Seizure precautions in place at this time.

## 2021-11-13 NOTE — ED Notes (Signed)
Pt complaining of nausea. Zofran given.

## 2021-11-13 NOTE — ED Notes (Signed)
Lunch tray given. 

## 2021-11-13 NOTE — Progress Notes (Signed)
Pt had witnessed seizure lasting three minutes.

## 2021-11-13 NOTE — ED Provider Notes (Signed)
  Physical Exam  BP 106/66   Pulse 74   Temp 98.4 F (36.9 C) (Axillary)   Resp 12   Ht 5' 2.5" (1.588 m)   Wt 76.7 kg   LMP 10/26/2021   SpO2 100%   BMI 30.42 kg/m   Physical Exam  Procedures  Procedures  ED Course / MDM    Medical Decision Making Amount and/or Complexity of Data Reviewed Labs: ordered. Radiology: ordered.  Risk Prescription drug management. Decision regarding hospitalization.   Patient had another seizure.  Now has stopped.  Discussed with Dr. Cheral Marker from neurology.  Will complete loading dose of Keppra up to 60/kg which would be another 2-1/2 g.  Is scheduled for 1 g this morning.  We will add another 1500 mg.  Also will add ammonia in case Depakote is needed.      Davonna Belling, MD 11/13/21 [phone]

## 2021-11-13 NOTE — ED Notes (Signed)
[facility] at bedside 

## 2021-11-13 NOTE — ED Notes (Signed)
ED TO INPATIENT HANDOFF REPORT  ED Nurse Name and Phone #: [phone]  S Name/Age/Gender Brandi Sanchez 21 y.o. female Room/Bed: APA18/APA18  Code Status   Code Status: Full Code  Home/SNF/Other Home Patient oriented to: self, place, time, and situation Is this baseline? Yes   Triage Complete: Triage complete  Chief Complaint Recurrent seizures (HCC) [G40.909]  Triage Note Patient brought in via EMS from [facility]. Patient alert and oriented. 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 Patient has hx and takes Keppra. Per [facility] Nurse patient has had seizure x4 lasting approx 4 minutes each. Patient had x1 seizure in route in which paramedic states lasted 15 secons. Patient postictal after but quickly back to baseline. IV placed. No medication given in route.    Allergies Allergies  Allergen Reactions   Latex Itching   Milk-Related Compounds Nausea And Vomiting    Level of Care/Admitting Diagnosis ED Disposition     ED Disposition  Admit   Condition  --   Comment  Hospital Area: [facility] [[id number]]  Level of Care: Telemetry Medical [[id number]]  May admit patient to [facility] or [facility] if equivalent level of care is available:: Yes  Covid Evaluation: Asymptomatic - no recent exposure (last 10 days) testing not required  Diagnosis: Recurrent seizures [facility]) [[id number]]  Admitting Physician: Frankey Shown [[id number]]  Attending Physician: Frankey Shown [[id number]]  Certification:: I certify this patient will need inpatient services for at least 2 midnights  Estimated Length of Stay: 3          B Medical/Surgery History Past Medical History:  Diagnosis Date   ADHD    Bipolar 1 disorder (HCC)    Seizures (HCC)    juvenile myoclonic epilepsy   History reviewed. No pertinent surgical history.   A IV Location/Drains/Wounds Patient Lines/Drains/Airways Status     Active Line/Drains/Airways      Name Placement date Placement time Site Days   Peripheral IV 11/12/21 20 G Right Antecubital 11/12/21  --  Antecubital  1            Intake/Output Last 24 hours  Intake/Output Summary (Last 24 hours) at 11/13/2021 1334 Last data filed at 11/13/2021 0109 Gross per 24 hour  Intake 500 ml  Output --  Net 500 ml    Labs/Imaging Results for orders placed or performed during the hospital encounter of 11/12/21 (from the past 48 hour(s))  CBG monitoring, ED     Status: None   Collection Time: 11/12/21  7:12 PM  Result Value Ref Range   Glucose-Capillary 82 70 - 99 mg/dL    Comment: Glucose reference range applies only to samples taken after fasting for at least 8 hours.  Comprehensive metabolic panel     Status: Abnormal   Collection Time: 11/12/21  7:18 PM  Result Value Ref Range   Sodium 139 135 - 145 mmol/L   Potassium 4.0 3.5 - 5.1 mmol/L   Chloride 111 98 - 111 mmol/L   CO2 22 22 - 32 mmol/L   Glucose, Bld 95 70 - 99 mg/dL    Comment: Glucose reference range applies only to samples taken after fasting for at least 8 hours.   BUN 11 6 - 20 mg/dL   Creatinine, Ser 3.23 0.44 - 1.00 mg/dL   Calcium 8.9 8.9 - 55.7 mg/dL   Total Protein 7.1 6.5 - 8.1 g/dL   Albumin 4.0 3.5 - 5.0 g/dL   AST 14 (L) 15 -  41 U/L   Sanchez 10 0 - 44 U/L   Alkaline Phosphatase 83 38 - 126 U/L   Total Bilirubin 0.4 0.3 - 1.2 mg/dL   GFR, Estimated >13>60 >24>60 mL/min    Comment: (NOTE) Calculated using the CKD-EPI Creatinine Equation (2021)    Anion gap 6 5 - 15    Comment: Performed at [facility], [address] [place], [place] [address]  CBC with Differential/Platelet     Status: None   Collection Time: 11/12/21  7:18 PM  Result Value Ref Range   WBC 7.9 4.0 - 10.5 K/uL   RBC 4.35 3.87 - 5.11 MIL/uL   Hemoglobin 12.8 12.0 - 15.0 g/dL   HCT 72.538.9 36.636.0 - 44.046.0 %   MCV 89.4 80.0 - 100.0 fL   MCH 29.4 26.0 - 34.0 pg   MCHC 32.9 30.0 - 36.0 g/dL   RDW 34.713.8 42.511.5 - 95.615.5 %   Platelets 264 150 - 400  K/uL   nRBC 0.0 0.0 - 0.2 %   Neutrophils Relative % 69 %   Neutro Abs 5.4 1.7 - 7.7 K/uL   Lymphocytes Relative 22 %   Lymphs Abs 1.8 0.7 - 4.0 K/uL   Monocytes Relative 7 %   Monocytes Absolute 0.6 0.1 - 1.0 K/uL   Eosinophils Relative 1 %   Eosinophils Absolute 0.1 0.0 - 0.5 K/uL   Basophils Relative 1 %   Basophils Absolute 0.0 0.0 - 0.1 K/uL   Immature Granulocytes 0 %   Abs Immature Granulocytes 0.02 0.00 - 0.07 K/uL    Comment: Performed at [facility], [address] [place], [place] [address]  Urinalysis, Routine w reflex microscopic Urine, Clean Catch     Status: Abnormal   Collection Time: 11/12/21  8:25 PM  Result Value Ref Range   Color, Urine YELLOW YELLOW   APPearance HAZY (A) CLEAR   Specific Gravity, Urine 1.019 1.005 - 1.030   pH 6.0 5.0 - 8.0   Glucose, UA NEGATIVE NEGATIVE mg/dL   Hgb urine dipstick NEGATIVE NEGATIVE   Bilirubin Urine NEGATIVE NEGATIVE   Ketones, ur NEGATIVE NEGATIVE mg/dL   Protein, ur NEGATIVE NEGATIVE mg/dL   Nitrite NEGATIVE NEGATIVE   Leukocytes,Ua SMALL (A) NEGATIVE   RBC / HPF 0-5 0 - 5 RBC/hpf   WBC, UA 6-10 0 - 5 WBC/hpf   Bacteria, UA FEW (A) NONE SEEN   Squamous Epithelial / LPF 6-10 0 - 5   Mucus PRESENT     Comment: Performed at [facility], [address] [place], [place] [address]  Rapid urine drug screen (hospital performed)     Status: None   Collection Time: 11/12/21  8:25 PM  Result Value Ref Range   Opiates NONE DETECTED NONE DETECTED   Cocaine NONE DETECTED NONE DETECTED   Benzodiazepines NONE DETECTED NONE DETECTED   Amphetamines NONE DETECTED NONE DETECTED   Tetrahydrocannabinol NONE DETECTED NONE DETECTED   Barbiturates NONE DETECTED NONE DETECTED    Comment: (NOTE) DRUG SCREEN FOR MEDICAL PURPOSES ONLY.  IF CONFIRMATION IS NEEDED FOR ANY PURPOSE, NOTIFY LAB WITHIN 5 DAYS.  LOWEST DETECTABLE LIMITS FOR URINE DRUG SCREEN Drug Class                     Cutoff (ng/mL) Amphetamine and metabolites     1000 Barbiturate and metabolites    200 Benzodiazepine                 200 Opiates and metabolites  300 Cocaine and metabolites        300 THC                            50 Performed at [facility], [address] [place], [place] [address]   POC urine preg, ED     Status: None   Collection Time: 11/13/21 12:21 AM  Result Value Ref Range   Preg Test, Ur NEGATIVE NEGATIVE    Comment:        THE SENSITIVITY OF THIS METHODOLOGY IS >24 mIU/mL   CBC     Status: Abnormal   Collection Time: 11/13/21  5:31 AM  Result Value Ref Range   WBC 6.0 4.0 - 10.5 K/uL   RBC 3.98 3.87 - 5.11 MIL/uL   Hemoglobin 11.6 (L) 12.0 - 15.0 g/dL   HCT 35.6 (L) 36.0 - 46.0 %   MCV 89.4 80.0 - 100.0 fL   MCH 29.1 26.0 - 34.0 pg   MCHC 32.6 30.0 - 36.0 g/dL   RDW 13.9 11.5 - 15.5 %   Platelets 217 150 - 400 K/uL   nRBC 0.0 0.0 - 0.2 %    Comment: Performed at [facility], [address] [place], [place] [address]  Comprehensive metabolic panel     Status: Abnormal   Collection Time: 11/13/21  5:31 AM  Result Value Ref Range   Sodium 140 135 - 145 mmol/L   Potassium 3.6 3.5 - 5.1 mmol/L   Chloride 109 98 - 111 mmol/L   CO2 25 22 - 32 mmol/L   Glucose, Bld 95 70 - 99 mg/dL    Comment: Glucose reference range applies only to samples taken after fasting for at least 8 hours.   BUN 11 6 - 20 mg/dL   Creatinine, Ser 0.83 0.44 - 1.00 mg/dL   Calcium 8.6 (L) 8.9 - 10.3 mg/dL   Total Protein 5.9 (L) 6.5 - 8.1 g/dL   Albumin 3.3 (L) 3.5 - 5.0 g/dL   AST 12 (L) 15 - 41 U/L   Sanchez 10 0 - 44 U/L   Alkaline Phosphatase 72 38 - 126 U/L   Total Bilirubin 0.7 0.3 - 1.2 mg/dL   GFR, Estimated >60 >60 mL/min    Comment: (NOTE) Calculated using the CKD-EPI Creatinine Equation (2021)    Anion gap 6 5 - 15    Comment: Performed at [facility], [address], [place], [place] [address]  APTT     Status: None   Collection Time: 11/13/21  5:31 AM  Result Value Ref Range   aPTT 28 24 - 36 seconds     Comment: Performed at [facility], [address], [place], [place] [address]  Magnesium     Status: None   Collection Time: 11/13/21  5:31 AM  Result Value Ref Range   Magnesium 2.1 1.7 - 2.4 mg/dL    Comment: Performed at [facility], [address], [place], [place] [address]  Phosphorus     Status: None   Collection Time: 11/13/21  5:31 AM  Result Value Ref Range   Phosphorus 4.1 2.5 - 4.6 mg/dL    Comment: Performed at [facility], [address] [place],  [address]  Ammonia     Status: None   Collection Time: 11/13/21  8:04 AM  Result Value Ref Range   Ammonia 20 9 - 35 umol/L    Comment: Performed at [facility], [place]  [address] [place], [place] [address]   CT Head Wo Contrast  Result Date: 11/12/2021 CLINICAL DATA:  Recurrent seizure EXAM: CT HEAD WITHOUT CONTRAST TECHNIQUE: Contiguous axial images were obtained from the base of the skull through the vertex without intravenous contrast. RADIATION DOSE REDUCTION: This exam was performed according to the departmental dose-optimization program which includes automated exposure control, adjustment of the mA and/or kV according to patient size and/or use of iterative reconstruction technique. COMPARISON:  09/06/2019 FINDINGS: Brain: No evidence of acute infarction, hemorrhage, hydrocephalus, extra-axial collection or mass lesion/mass effect. Vascular: No hyperdense vessel or unexpected calcification. Skull: Normal. Negative for fracture or focal lesion. Sinuses/Orbits: The visualized paranasal sinuses are essentially clear. The mastoid air cells are unopacified. Other: None. IMPRESSION: Normal head CT. Electronically Signed   By: Charline Bills M.D.   On: 11/12/2021 19:45    Pending Labs Unresulted Labs (From admission, onward)     Start     Ordered   11/20/21 0500  Creatinine, serum  (enoxaparin (LOVENOX)    CrCl >/= 30 ml/min)  Weekly,   R     Comments: while on enoxaparin therapy    11/13/21 0440   11/13/21 0955  Urine  Culture  (Urine Culture)  Add-on,   AD       Question:  Indication  Answer:  Altered mental status (if no other cause identified)   11/13/21 0954   11/13/21 0440  HIV Antibody (routine testing w rflx)  (HIV Antibody (Routine testing w reflex) panel)  Once,   R        11/13/21 0440   11/12/21 1908  Keppra (Levetiracetam) level  Once,   URGENT        11/12/21 1907            Vitals/Pain Today's Vitals   11/13/21 1223 11/13/21 1225 11/13/21 1230 11/13/21 1300  BP:  112/63 (!) 102/54 104/70  Pulse:  79 77 75  Resp:  19 16 16   Temp: 98.4 F (36.9 C)     TempSrc: Oral     SpO2:  100% 100% 98%  Weight:      Height:      PainSc:        Isolation Precautions No active isolations  Medications Medications  enoxaparin (LOVENOX) injection 40 mg (40 mg Subcutaneous Given 11/13/21 0902)  acetaminophen (TYLENOL) tablet 650 mg (has no administration in time range)    Or  acetaminophen (TYLENOL) suppository 650 mg (has no administration in time range)  ondansetron (ZOFRAN) tablet 4 mg ( Oral See Alternative 11/13/21 0902)    Or  ondansetron (ZOFRAN) injection 4 mg (4 mg Intravenous Given 11/13/21 0902)  levETIRAcetam (KEPPRA) IVPB 1000 mg/100 mL premix (1,000 mg Intravenous New Bag/Given 11/13/21 0825)  LORazepam (ATIVAN) injection 2 mg (2 mg Intravenous Given 11/13/21 1220)  cephALEXin (KEFLEX) capsule 500 mg (500 mg Oral Given 11/13/21 1016)  dextrose 5 %-0.45 % sodium chloride infusion ( Intravenous New Bag/Given 11/13/21 1019)  levETIRAcetam (KEPPRA) IVPB 1000 mg/100 mL premix (0 mg Intravenous Stopped 11/12/21 2024)  LORazepam (ATIVAN) injection 2 mg (2 mg Intravenous Given 11/12/21 1920)  levETIRAcetam (KEPPRA) IVPB 1000 mg/100 mL premix (0 mg Intravenous Stopped 11/12/21 2136)  LORazepam (ATIVAN) injection 1 mg (1 mg Intravenous Given 11/13/21 0005)  LORazepam (ATIVAN) injection 1 mg (1 mg Intravenous Given 11/13/21 0415)  LORazepam (ATIVAN) injection 1 mg (1 mg Intravenous Given 11/13/21  0440)  levETIRAcetam (KEPPRA) IVPB 1500 mg/ 100 mL premix (0 mg Intravenous Stopped 11/13/21 0817)  LORazepam (ATIVAN) 2 MG/ML injection (1 mg  Given 11/13/21 5110)    Mobility walks High fall risk   Focused Assessments Neuro Assessment Handoff:  Swallow screen pass?  Cardiac Rhythm: Normal sinus rhythm       Neuro Assessment: Within Defined Limits Neuro Checks:      Last Documented NIHSS Modified Score:   Has TPA been given? No  R Recommendations: See Admitting Provider Note  Report given to:   Additional Notes:

## 2021-11-13 NOTE — ED Notes (Signed)
Pt voided unmeasured amount in bedpan

## 2021-11-13 NOTE — Progress Notes (Addendum)
Patient seen and evaluated, chart reviewed, please see EMR for updated orders. Please see full H&P dictated by admitting physician Dr. Josephine Cables for same date of service.    Brief Summary:- 21 year old with past medical history relevant for history of seizure disorder since age 54 (juvenile myoclonic epilepsy) and a strong family history of seizure disorder in first-degree relatives presenting from correctional facility with recurrent seizures despite being compliant with Keppra PTA   A/p 1)Seizure disorder------- patient has been having seizures since age 5 (juvenile myoclonic epilepsy) and a strong family history of seizure disorder in first-degree relatives-- -patient presents with recurrent seizures, continues to have seizures in the ED despite lorazepam and Keppra IV load -UDS negative -On-call neurologist recommends transfer to [facility] for further neurology evaluation and management -Continue Keppra and as needed lorazepam for now  2) mild acute anemia--- suspect this is hemodilutional from IV fluids -LMP 10/26/2021 -No bleeding concerns  3) possible UTI--- UA noted, no fevers or leukocytosis, no flank pain -use Keflex pending culture data  -- Total care time is over 57 minutes  Patient seen and evaluated, chart reviewed, please see EMR for updated orders. Please see full H&P dictated by admitting physician Dr. Josephine Cables for same date of service.   Roxan Hockey, MD

## 2021-11-13 NOTE — ED Notes (Addendum)
Pt with seizure like activity lasting approximately 60 seconds, O2 sat stayed 90% and above. EDP at bedside.

## 2021-11-13 NOTE — ED Notes (Signed)
Hospitalist at bedside. Ativan initially ordered for 1mg , states to give an additional 1mg  now.

## 2021-11-13 NOTE — Assessment & Plan Note (Signed)
BMI 30.4 

## 2021-11-14 DIAGNOSIS — R569 Unspecified convulsions: Secondary | ICD-10-CM | POA: Diagnosis not present

## 2021-11-14 DIAGNOSIS — G40909 Epilepsy, unspecified, not intractable, without status epilepticus: Secondary | ICD-10-CM | POA: Diagnosis not present

## 2021-11-14 LAB — URINE CULTURE

## 2021-11-14 MED ORDER — LEVETIRACETAM 1000 MG PO TABS: 1000.0000 mg | ORAL_TABLET | Freq: Two times a day (BID) | ORAL | 0 refills | Status: AC

## 2021-11-14 MED ORDER — LEVETIRACETAM 1000 MG PO TABS: 1000.0000 mg | ORAL_TABLET | Freq: Two times a day (BID) | ORAL | 0 refills | Status: DC

## 2021-11-14 NOTE — Progress Notes (Signed)
Activations were completed before DC of LTM EEG. LTM EEG discontinued - no skin breakdown at [facility].

## 2021-11-14 NOTE — Progress Notes (Signed)
NEUROLOGY CONSULTATION PROGRESS NOTE   Date of service: November 14, 2021 Patient Name: Brandi Sanchez MRN:  [id number] DOB:  08/22/2000   Interval Hx   2 episodes captured on EEG and not epileptic.  Vitals   Vitals:   11/13/21 2314 11/14/21 0319 11/14/21 0835 11/14/21 1146  BP: 97/70 102/65 106/66 103/70  Pulse: 73 63 75 73  Resp: 14 15 17 20   Temp: 98 F (36.7 C) 98.4 F (36.9 C) 98.3 F (36.8 C) 98.4 F (36.9 C)  TempSrc: Oral  Oral Oral  SpO2: 100% 100% 100% 100%  Weight:      Height:         Body mass index is 30.42 kg/m.  Physical Exam   General: Laying comfortably in bed; in no acute distress.  HENT: Normal oropharynx and mucosa. Normal external appearance of ears and nose.  Neck: Supple, no pain or tenderness  CV: No JVD. No peripheral edema.  Pulmonary: Symmetric Chest rise. Normal respiratory effort.  Abdomen: Soft to touch, non-tender.  Ext: No cyanosis, edema, or deformity  Skin: No rash. Normal palpation of skin.   Musculoskeletal: Normal digits and nails by inspection. No clubbing.   Neurologic Examination  Mental status/Cognition: Alert, oriented to self, place, month and year, good attention.  Speech/language: Fluent, comprehension intact, object naming intact, repetition intact.  Cranial nerves:   CN II Pupils equal and reactive to light, no VF deficits    CN III,IV,VI EOM intact, no gaze preference or deviation, no nystagmus    CN V normal sensation in V1, V2, and V3 segments bilaterally    CN VII no asymmetry, no nasolabial fold flattening    CN VIII normal hearing to speech    CN IX & X normal palatal elevation, no uvular deviation    CN XI 5/5 head turn and 5/5 shoulder shrug bilaterally    CN XII midline tongue protrusion    Motor:  Muscle bulk: normal, tone normal, pronator drift none tremor none Mvmt Root Nerve  Muscle Right Left Comments  SA C5/6 Ax Deltoid     EF C5/6 Mc Biceps 4+ 4+   EE C6/7/8 Rad Triceps 4+ 4+   WF C6/7 Med FCR      WE C7/8 PIN ECU     F Ab C8/T1 U ADM/FDI 4+ 4+   HF L1/2/3 Fem Illopsoas 4+ 4+   KE L2/3/4 Fem Quad 4+ 4+   DF L4/5 D Peron Tib Ant 4+ 4+   PF S1/2 Tibial Grc/Sol 4+ 4+    Sensation:  Light touch Rough in her legs initially but repeat testing and reports feels the same.   Pin prick    Temperature    Vibration   Proprioception    Coordination/Complex Motor:  - Finger to Nose intact BL - Rapid alternating movement are slowed throughout - Gait: deferred for patient safety.  Labs   Basic Metabolic Panel:  Lab Results  Component Value Date   NA 140 11/13/2021   K 3.6 11/13/2021   CO2 25 11/13/2021   GLUCOSE 95 11/13/2021   BUN 11 11/13/2021   CREATININE 0.83 11/13/2021   CALCIUM 8.6 (L) 11/13/2021   GFRNONAA >60 11/13/2021   GFRAA >60 09/06/2019   HbA1c: No results found for: "HGBA1C" LDL: No results found for: "LDLCALC" Urine Drug Screen:     Component Value Date/Time   LABOPIA NONE DETECTED 11/12/2021 2025   COCAINSCRNUR NONE DETECTED 11/12/2021 2025   LABBENZ NONE DETECTED 11/12/2021 2025  AMPHETMU NONE DETECTED 11/12/2021 2025   THCU NONE DETECTED 11/12/2021 2025   LABBARB NONE DETECTED 11/12/2021 2025    Alcohol Level     Component Value Date/Time   ETH <10 10/19/2021 1930   No results found for: "PHENYTOIN", "ZONISAMIDE", "LAMOTRIGINE", "LEVETIRACETA" No results found for: "PHENYTOIN", "PHENOBARB", "VALPROATE", "CBMZ"  Imaging and Diagnostic studies   CT Head Wo Contrast Normal head CT.  LTM EEG: This study is within normal limits. No seizures or epileptiform discharges were seen throughout the recording.  The excessive beta activity is most likely secondary to benzodiazepine use and is a benign EEG pattern.   Two events were recorded as described above without EEG change.  These episodes were nonepileptic   One event was recorded during photic stimulation as described above without concomitant EEG change.  No EEG change was seen during  hyperventilation   Impression   Brandi Sanchez is a 21 y.o. incarcerated female with a past medical history of ADHD, bipolar 1, and juvenile myoclonic epilepsy presenting with breakthrough seizures. She was reported to have 4 seizures at the facility prior to coming to the ED. There was a short post ictal period after the seizures. She then had at least 5 documented episodes of seizure like activity while in the [facility] ED. She was sent to [facility] for LTM EEG.  2 of her events were captured on LTM and a 3rd was captured during photic stimulation.  NO EEG CORRELATE WITH THESE EVENTS AND THE EVENTS ARE NON-EPILEPTIC.  Recommendations  - No further inpatient neurological workup. - Verified with patient and she reports taking Keppra 1000mg  BID. Continue Keppra 1000mg  BID at discharge. - discontinue LTM EEG. Okay for discharge from a neurological standpoint. - follow up with neurology outpatient. ______________________________________________________________________   Thank you for the opportunity to take part in the care of this patient. If you have any further questions, please contact the neurology consultation attending.  Signed,  [place] Pager Number [phone]

## 2021-11-14 NOTE — Hospital Course (Signed)
Mrs. Riggle is a 21 y.o. F with juvenile myoclonic epilepsy, also carries diagnoses Bipolar and ADHD (although on treatment for neither) who was brought from jail for 4 witnessed seizure-like episodes.  In the ER, she had further seizure-like activity, was loaded with Keppra and transferred to [facility] for video cEEG.   11/5: Admitted at [facility], loaded with Keppra, transferred to [facility], Neurology consulted, vEEG started

## 2021-11-14 NOTE — TOC Transition Note (Signed)
Transition of Care [facility]) - CM/SW Discharge Note   Patient Details  Name: Damia Bobrowski MRN: [id number] Date of Birth: 09-01-2000  Transition of Care [facility]) CM/SW Contact:  Pollie Friar, RN Phone Number: 11/14/2021, 10:45 AM   Clinical Narrative:    Pt is discharging back to jail today. Guards will provide needed transport.  Bedside RN will provide the guards the prescription for Keppra change.    Final next level of care: Other (comment) (jail) Barriers to Discharge: No Barriers Identified   Patient Goals and CMS Choice        Discharge Placement                       Discharge Plan and Services                                     Social Determinants of Health (SDOH) Interventions     Readmission Risk Interventions     No data to display

## 2021-11-14 NOTE — Procedures (Addendum)
Patient Name: Brandi Sanchez  MRN: [id number]  Epilepsy Attending: Lora Havens  Referring Physician/Provider: Kerney Elbe, MD  Duration: 11/13/2021 1720 to 11/14/2021 0946  Patient history:  21 y.o. incarcerated female with a past medical history of ADHD, bipolar 1, and juvenile myoclonic epilepsy presenting with breakthrough seizures. EEG to evaluate for seizure  Level of alertness: Awake, asleep  AEDs during EEG study: LEV  Technical aspects: This EEG study was done with scalp electrodes positioned according to the 10-20 International system of electrode placement. Electrical activity was reviewed with band pass filter of 1-70Hz , sensitivity of 7 uV/mm, display speed of 22mm/sec with a 60Hz  notched filter applied as appropriate. EEG data were recorded continuously and digitally stored.  Video monitoring was available and reviewed as appropriate.  Description: The posterior dominant rhythm consists of 9-10 Hz activity of moderate voltage (25-35 uV) seen predominantly in posterior head regions, symmetric and reactive to eye opening and eye closing. Sleep was characterized by vertex waves, sleep spindles (12 to 14 Hz), maximal frontocentral region.  There is an excessive amount of 15 to 18 Hz beta activity distributed symmetrically and diffusely.   Patient event was recorded on 11/13/2021 at 2015.  Patient was initially laying in left lateral position.  She then had arching her back followed by stiffening of bilateral upper extremities and subsequently jerking of her trunk. Concomitant EEG before, during and after the event showed normal posterior dominant rhythm and did not show any EEG changes suggest seizure.  Patient event was recorded  on 11/14/2021 at 0640.  Patient was laying in left lateral position.  She then had arching her back, trying to lift her head off the bed and had jerking in  "yes- yes" manner, not responding to RN. Concomitant EEG before, during and after the event showed  normal posterior dominant rhythm and did not show any EEG changes suggest seizure.  Photic stimulation was performed on 11/14/2021 at 0935.  During photic stimulation, patient was noted to be laying in bed with eyes closed, had side-to-side nonrhythmic head movement followed by arching of the back intermittently. Subsequently, patient was noted to have whole-body jerking, not responding to technologist. Concomitant EEG before, during and after the event did not show any EEG change to suggest seizure.   No EEG change was seen during hyperventilation  ABNORMALITY - Excessive beta, generalized  IMPRESSION: This study is within normal limits. No seizures or epileptiform discharges were seen throughout the recording.  The excessive beta activity is most likely secondary to benzodiazepine use and is a benign EEG pattern.  Two events were recorded as described above without EEG change.  These episodes were nonepileptic  One event was recorded during photic stimulation as described above without concomitant EEG change.  This was not  Lora Havens

## 2021-11-14 NOTE — Discharge Summary (Signed)
Physician Discharge Summary   Patient: Brandi Sanchez MRN: [id number] DOB: 2000-02-08  Admit date:     11/12/2021  Discharge date: 11/14/21  Discharge Physician: Alberteen Sam   PCP: System, Provider Not In     Recommendations at discharge:  Please ensure the patient is evaluated by a Neurologist within 3 months Please ensure the patient receives levetiracetam/Keppra 1000 mg twice daily without missed doses     Discharge Diagnoses: Principal Problem:   Pseudoseizures Active Problems:   Juvenile myoclonic epilepsy   Obesity (BMI 30-39.9)     Hospital Course: Brandi Sanchez is a 21 y.o. F with juvenile myoclonic epilepsy, also carries diagnoses Bipolar and ADHD (although on treatment for neither) who was brought from jail for 4 witnessed seizure-like episodes.  In the ER, she had further seizure-like activity, was loaded with Keppra and transferred to [facility] for video cEEG.   11/5: Admitted at [facility], loaded with Keppra, transferred to [facility], Neurology consulted, vEEG started       Pseudoseizures The patient was observed to have repeated seizure-like activity despite loading with Keppra.  These had the appearance of generalized tonic clonic seizures, and because they were poorly controlled and multiple per day, required further evaluation with continuous EEG.  Neurology were consulted and the patient was monitored with continuous video EEG.  Episodes were recorded and were found to be not correlated with epileptic activity (ie pseudoseizures).  Neurology recommended d/c video EEG and resumption of her home antiepileptics.   Obesity (BMI 30-39.9) BMI 30.4             Consultants: Neurology Procedures performed: video continuous EEG  Disposition: Incarcerated Diet recommendation:  Discharge Diet Orders (From admission, onward)     Start     Ordered   11/14/21 0000  Diet - low sodium heart healthy        11/14/21 1026             DISCHARGE  MEDICATION: Allergies as of 11/14/2021       Reactions   Latex Itching   Milk-related Compounds Nausea And Vomiting        Medication List     TAKE these medications    levETIRAcetam 1000 MG tablet Commonly known as: KEPPRA Take 1 tablet (1,000 mg total) by mouth 2 (two) times daily. What changed:  medication strength how much to take Another medication with the same name was removed. Continue taking this medication, and follow the directions you see here.        Follow-up Information     Beryle Beams, MD.   Specialty: Neurology Contact information: [address] [place] [address] [phone]                 Discharge Instructions     Diet - low sodium heart healthy   Complete by: As directed    Increase activity slowly   Complete by: As directed        Discharge Exam: Filed Weights   11/12/21 1908  Weight: 76.7 kg    General: Pt is alert, awake, not in acute distress Cardiovascular: RRR, nl S1-S2, no murmurs appreciated.   No LE edema.   Respiratory: Normal respiratory rate and rhythm.  CTAB without rales or wheezes. Abdominal: Abdomen soft and non-tender.  No distension or HSM.   Neuro/Psych: Strength symmetric in upper and lower extremities.  Judgment and insight appear normal.   Condition at discharge: good  The results of significant diagnostics from this hospitalization (including imaging,  microbiology, ancillary and laboratory) are listed below for reference.   Imaging Studies: Overnight EEG with video  Result Date: 11/14/2021 Brandi Quest, MD     11/14/2021 10:04 AM Patient Name: Brandi Sanchez MRN: [id number] Epilepsy Attending: [name] Referring Physician/Provider: Caryl Pina, MD Duration: 11/13/2021 1720 to 11/14/2021 0946 Patient history:  21 y.o. incarcerated female with a past medical history of ADHD, bipolar 1, and juvenile myoclonic epilepsy presenting with breakthrough seizures. EEG to evaluate for seizure Level of  alertness: Awake, asleep AEDs during EEG study: LEV Technical aspects: This EEG study was done with scalp electrodes positioned according to the 10-20 International system of electrode placement. Electrical activity was reviewed with band pass filter of 1-70Hz , sensitivity of 7 uV/mm, display speed of 59mm/sec with a 60Hz  notched filter applied as appropriate. EEG data were recorded continuously and digitally stored.  Video monitoring was available and reviewed as appropriate. Description: The posterior dominant rhythm consists of 9-10 Hz activity of moderate voltage (25-35 uV) seen predominantly in posterior head regions, symmetric and reactive to eye opening and eye closing. Sleep was characterized by vertex waves, sleep spindles (12 to 14 Hz), maximal frontocentral region.  There is an excessive amount of 15 to 18 Hz beta activity distributed symmetrically and diffusely. Patient event was recorded on 11/13/2021 at 2015.  Patient was initially laying in left lateral position.  She then had arching her back followed by stiffening of bilateral upper extremities and subsequently jerking of her trunk. Concomitant EEG before, during and after the event showed normal posterior dominant rhythm and did not show any EEG changes suggest seizure. Patient event was recorded  on 11/14/2021 at 0640.  Patient was laying in left lateral position.  She then had arching her back, trying to lift her head off the bed and had jerking in  "yes- yes" manner, not responding to RN. Concomitant EEG before, during and after the event showed normal posterior dominant rhythm and did not show any EEG changes suggest seizure. Photic stimulation was performed on 11/14/2021 at 0935.  During photic stimulation, patient was noted to be laying in bed with eyes closed, had side-to-side nonrhythmic head movement followed by arching of the back intermittently. Subsequently, patient was noted to have whole-body jerking, not responding to technologist.  Concomitant EEG before, during and after the event did not show any EEG change to suggest seizure. No EEG change was seen during hyperventilation ABNORMALITY - Excessive beta, generalized IMPRESSION: This study is within normal limits. No seizures or epileptiform discharges were seen throughout the recording.  The excessive beta activity is most likely secondary to benzodiazepine use and is a benign EEG pattern. Two events were recorded as described above without EEG change.  These episodes were nonepileptic One event was recorded during photic stimulation as described above without concomitant EEG change.  This was not 13/06/2021   CT Head Wo Contrast  Result Date: 11/12/2021 CLINICAL DATA:  Recurrent seizure EXAM: CT HEAD WITHOUT CONTRAST TECHNIQUE: Contiguous axial images were obtained from the base of the skull through the vertex without intravenous contrast. RADIATION DOSE REDUCTION: This exam was performed according to the departmental dose-optimization program which includes automated exposure control, adjustment of the mA and/or kV according to patient size and/or use of iterative reconstruction technique. COMPARISON:  09/06/2019 FINDINGS: Brain: No evidence of acute infarction, hemorrhage, hydrocephalus, extra-axial collection or mass lesion/mass effect. Vascular: No hyperdense vessel or unexpected calcification. Skull: Normal. Negative for fracture or focal lesion. Sinuses/Orbits: The visualized  paranasal sinuses are essentially clear. The mastoid air cells are unopacified. Other: None. IMPRESSION: Normal head CT. Electronically Signed   By: Julian Hy M.D.   On: 11/12/2021 19:45   DG Chest Portable 1 View  Result Date: 10/19/2021 CLINICAL DATA:  Fall, seizure EXAM: PORTABLE CHEST 1 VIEW COMPARISON:  02/25/2018 FINDINGS: The heart size and mediastinal contours are within normal limits. Both lungs are clear. The visualized skeletal structures are unremarkable. IMPRESSION: No active  disease. Electronically Signed   By: Donavan Foil M.D.   On: 10/19/2021 19:20    Microbiology: No results found for this or any previous visit.  Labs: CBC: Recent Labs  Lab 11/12/21 1918 11/13/21 0531  WBC 7.9 6.0  NEUTROABS 5.4  --   HGB 12.8 11.6*  HCT 38.9 35.6*  MCV 89.4 89.4  PLT 264 916   Basic Metabolic Panel: Recent Labs  Lab 11/12/21 1918 11/13/21 0531  NA 139 140  K 4.0 3.6  CL 111 109  CO2 22 25  GLUCOSE 95 95  BUN 11 11  CREATININE 0.81 0.83  CALCIUM 8.9 8.6*  MG  --  2.1  PHOS  --  4.1   Liver Function Tests: Recent Labs  Lab 11/12/21 1918 11/13/21 0531  AST 14* 12*  ALT 10 10  ALKPHOS 83 72  BILITOT 0.4 0.7  PROT 7.1 5.9*  ALBUMIN 4.0 3.3*   CBG: Recent Labs  Lab 11/12/21 1912  GLUCAP 82    Discharge time spent: approximately 35 minutes spent on discharge counseling, evaluation of patient on day of discharge, and coordination of discharge planning with nursing, social work, pharmacy and case management  Signed: Edwin Dada, MD [facility] 11/14/2021

## 2021-11-14 NOTE — Progress Notes (Signed)
At approx 0650 pt activated call bell. This nurse responded to find pt experiencing seizure like activity similar to the previous night, with her back arched. Activity lasted for two minutes. Eyes were closed. No incontinent episode. Within one minute of activity ceasing, pt was able to answer questions and request to use the bedside commode. Day shift notified, and will continue to monitor pt.

## 2021-11-16 LAB — LEVETIRACETAM LEVEL: Levetiracetam Lvl: 39.4 ug/mL (ref 10.0–40.0)

## 2021-11-22 NOTE — ED Provider Notes (Signed)
 Emergency Department Provider Note    ED Clinical Impression   Final diagnoses:  Seizure (CMS-HCC) (Primary)    ED Assessment/Plan    Condition: Stable Disposition: Discharge  This chart has been completed using Dragon Medical Dictation software, and while attempts have been made to ensure accuracy, certain words and phrases may not be transcribed as intended.   History   Chief Complaint  Patient presents with  . Seizure - Prior History Of   HPI  Brandi Sanchez is a 21 y.o. female  who presents today to the  emergency department complaining of seizure described as generalized tonic-clonic activity today.  Patient is incarcerated and states that she has been compliant with her Keppra  ever since she has been in jail.  She denies any recent illness.  She complains of a slight headache and nausea now.  Her last p.o. seizure episode was last week.    Allergies: is allergic to latex, natural rubber. Medications: is not on any long-term medications. PMHx:  has a past medical history of Asthma, Overdose, and Seizures (CMS-HCC). PSHx:  has no past surgical history on file. SocHx:  reports that she has been smoking cigarettes. She has a 5.00 pack-year smoking history. She does not have any smokeless tobacco history on file. She reports that she does not currently use alcohol. She reports that she does not currently use drugs. Allergies, Medications, Medical, Surgical, and Social History were reviewed as documented above.   Social Determinants of Health with Concerns   Financial Resource Strain: Not on file  Internet Connectivity: Not on file  Food Insecurity: Not on file  Tobacco Use: High Risk (09/16/2020)   Patient History   . Smoking Tobacco Use: Every Day   . Smokeless Tobacco Use: Unknown   . Passive Exposure: Not on file  Housing/Utilities: Not on file  Alcohol Use: Not on file  Transportation Needs: Not on file  Substance Use: Not on file  Health Literacy: Not on file   Physical Activity: Not on file  Interpersonal Safety: Not on file  Stress: Not on file  Intimate Partner Violence: Not on file  Depression: Not on file  Social Connections: Not on file     Review Of Systems  Review of Systems  Constitutional:  Negative for fever.  HENT:  Negative for congestion.   Respiratory:  Negative for chest tightness and shortness of breath.   Cardiovascular:  Negative for chest pain.  Gastrointestinal:  Negative for abdominal pain.  Skin:  Negative for color change.  Neurological:  Positive for seizures.  Psychiatric/Behavioral:  Negative for behavioral problems.   All other systems reviewed and are negative.   Physical Exam   BP 118/78   Pulse 86   Temp 36.6 C (97.9 F) (Oral)   Resp 18   Ht 157.5 cm (5' 2)   Wt 77.1 kg (170 lb)   SpO2 100%   BMI 31.09 kg/m   Physical Exam Vitals and nursing note reviewed.  Constitutional:      General: She is not in acute distress. HENT:     Head: Normocephalic.     Mouth/Throat:     Comments: I do not appreciate any tongue abrasion. Eyes:     Conjunctiva/sclera: Conjunctivae normal.  Cardiovascular:     Rate and Rhythm: Regular rhythm.     Pulses: Normal pulses.     Heart sounds: Normal heart sounds.  Pulmonary:     Effort: No respiratory distress.     Breath sounds: Normal  breath sounds.  Abdominal:     General: There is no distension.     Palpations: Abdomen is soft.     Tenderness: There is no abdominal tenderness. There is no guarding or rebound.  Musculoskeletal:        General: No deformity.  Skin:    General: Skin is warm.     Capillary Refill: Capillary refill takes 2 to 3 seconds.     Comments: Normal cap refill.  Neurological:     General: No focal deficit present.     Mental Status: She is oriented to person, place, and time.     Cranial Nerves: No cranial nerve deficit.     Sensory: No sensory deficit.     Comments: There is no motor, sensory or cerebellar deficits.   Nonfocal neurologic exam.  GCS is 15.  Cranial nerves grossly intact.  Psychiatric:        Mood and Affect: Mood normal.     ED Course  Medical Decision Making Clinical picture suggest epileptic seizure.  Will check basic labs.  This is similar to seizures so there is no indication for imaging.\  8:41 AM Patient is doing well.  Labs unremarkable.  Potassium replaced.  She is stable for discharge.  He is at her baseline.  She is eating breakfast.  I have reviewed my clinical findings and studies and my clinical impression with the patient. The patient has expressed understanding that at this time there is no evidence for a more malignant underlying process, but the patient also understands that early in the process of a condition such as this, an initial workup can be falsely reassuring. I have counseled the patient and discussed follow-up with the patient, stressing the importance of appropriate follow-up. I have also counseled the patient to return if worse or any concerns. Routine discharge counseling was given to the patient and the patient understands that worsening, changing or persistent symptoms should prompt an immediate call or follow up with their primary physician or return to the emergency department for reevaluation. Patient has expressed understanding.     Problems Addressed: Seizure (CMS-HCC): acute illness or injury that poses a threat to life or bodily functions  Amount and/or Complexity of Data Reviewed Independent Historian: EMS    Details: Additional history obtained from EMS. Labs: ordered. Decision-making details documented in ED Course.  Risk Prescription drug management. Decision regarding hospitalization.     Procedures   No results found for this visit on 11/22/21 (from the past 4464 hour(s)).   ED Results Results for orders placed or performed during the hospital encounter of 11/22/21  Magnesium  Result Value Ref Range   Magnesium 1.7 1.6 - 2.6  mg/dL  Basic Metabolic Panel  Result Value Ref Range   Sodium 136 135 - 145 mmol/L   Potassium 3.2 (L) 3.5 - 5.0 mmol/L   Chloride 105 98 - 107 mmol/L   CO2 26.3 21.0 - 32.0 mmol/L   Anion Gap 5 3 - 11 mmol/L   BUN 11 8 - 20 mg/dL   Creatinine 9.11 9.39 - 1.10 mg/dL   BUN/Creatinine Ratio 13    eGFR CKD-EPI (2021) Female >90 >=60 mL/min/1.73m2   Glucose 91 70 - 179 mg/dL   Calcium 8.7 8.5 - 89.8 mg/dL  CBC w/ Differential  Result Value Ref Range   WBC 8.5 4.0 - 10.5 10*9/L   RBC 3.93 3.80 - 5.10 10*12/L   HGB 11.5 11.5 - 15.0 g/dL   HCT 64.9 65.9 -  44.0 %   MCV 89.1 80.0 - 98.0 fL   MCH 29.3 27.0 - 34.0 pg   MCHC 32.9 32.0 - 36.0 g/dL   RDW 86.4 88.4 - 85.4 %   MPV 10.6 (H) 7.4 - 10.4 fL   Platelet 265 140 - 415 10*9/L   Neutrophils % 73.6 %   Lymphocytes % 18.4 %   Monocytes % 5.9 %   Eosinophils % 1.2 %   Basophils % 0.5 %   Absolute Neutrophils 6.3 1.8 - 7.8 10*9/L   Absolute Lymphocytes 1.6 0.7 - 4.5 10*9/L   Absolute Monocytes 0.5 0.1 - 1.0 10*9/L   Absolute Eosinophils 0.1 0.0 - 0.4 10*9/L   Absolute Basophils 0.0 0.0 - 0.2 10*9/L   No results found.  Medications Administered:  Medications  ondansetron  (ZOFRAN ) injection 4 mg (4 mg Intravenous Given 11/22/21 0639)  ketorolac (TORADOL) injection 30 mg (30 mg Intravenous Given 11/22/21 0639)  potassium chloride ER tablet 40 mEq (40 mEq Oral Given 11/22/21 9167)    Discharge Medications (Medications Prescribed during this  ED visit and Patient's Home Medications) :    Your Medication List     ASK your doctor about these medications    ibuprofen 800 MG tablet Commonly known as: MOTRIN Take 1 tablet (800 mg total) by mouth every six (6) hours as needed.   multivitamin, prenatal (folic acid-iron) 27-1 mg Tab Take 1 tablet by mouth in the morning.          Cherie Ardeen Hanger, MD 11/22/21 [phone]

## 2022-01-07 IMAGING — CT CT CERVICAL SPINE W/O CM
3 of 4 series · 11 of 33 positions shown, 13 images · non-contrast
Comparison: CT brain 02/25/2018

CLINICAL DATA: Seizure hit left side of head

EXAM:
CT HEAD WITHOUT CONTRAST
CT CERVICAL SPINE WITHOUT CONTRAST
TECHNIQUE: Multidetector CT imaging of the head and cervical spine was
performed following the standard protocol without intravenous
contrast. Multiplanar CT image reconstructions of the cervical spine
were also generated.

[Series 5: sagittal bone · sagittal · 0.25mm/px · 5 of 61 slices shown, 6 images]
[im 21/61  bone]
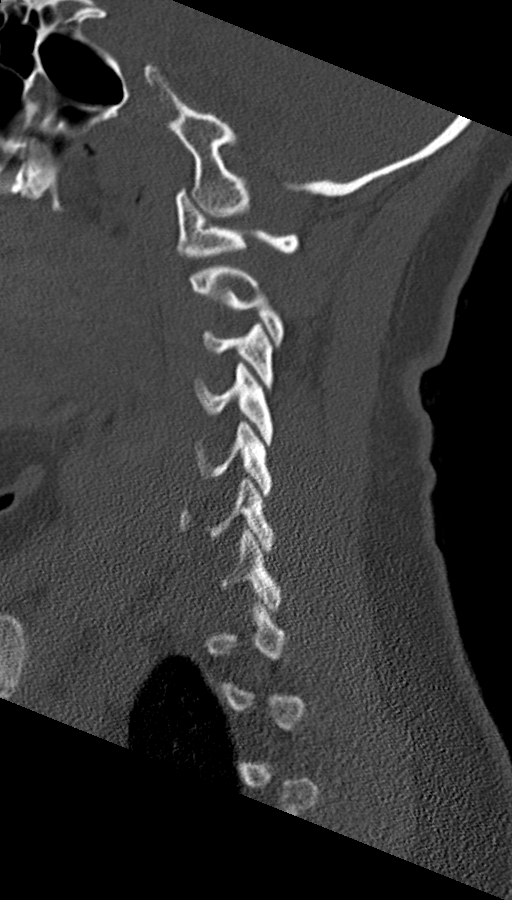
[im 26/61  bone]
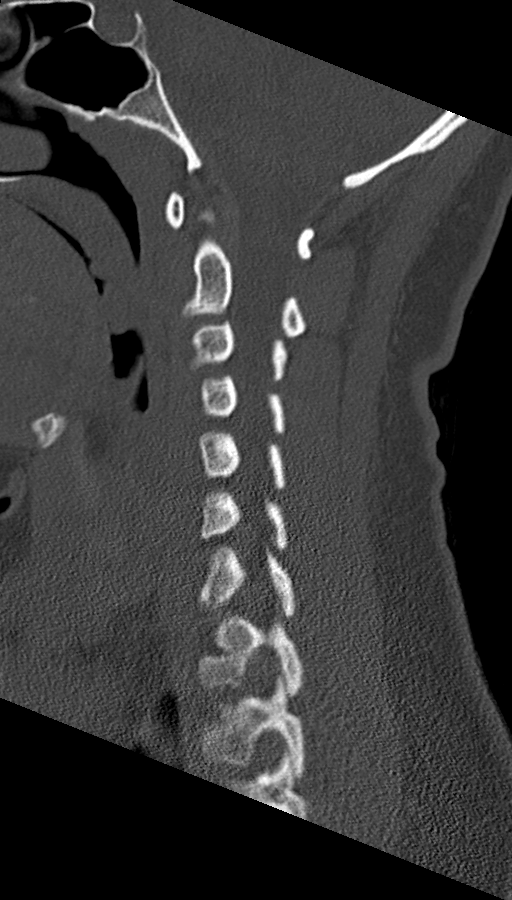
[im 31/61  soft-tissue]
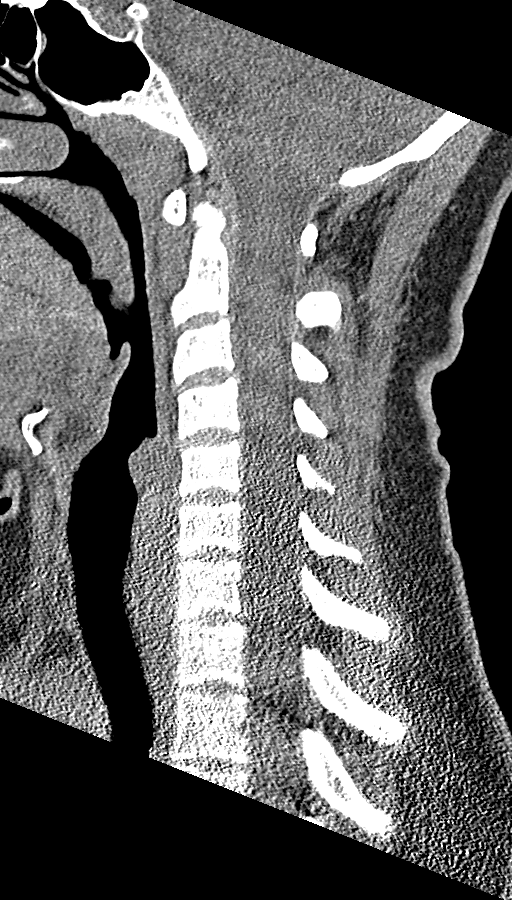
[im 31/61  bone]
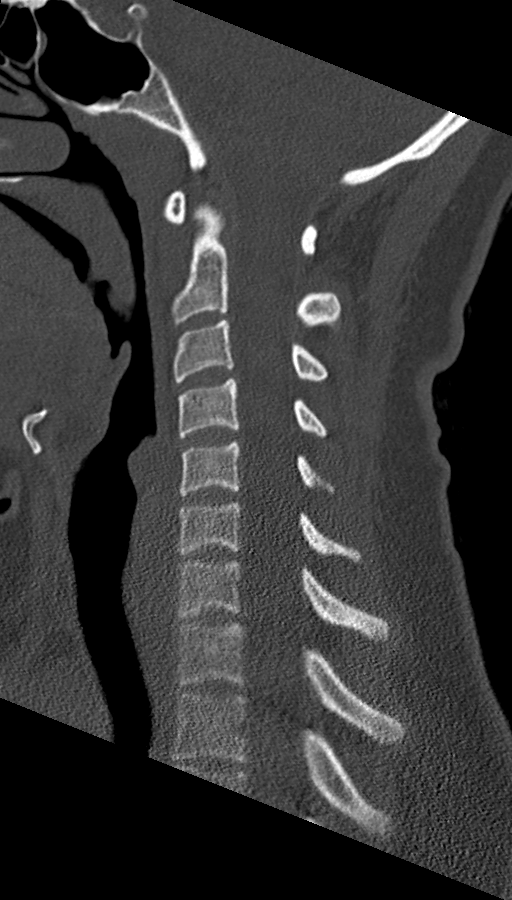
[im 36/61  bone]
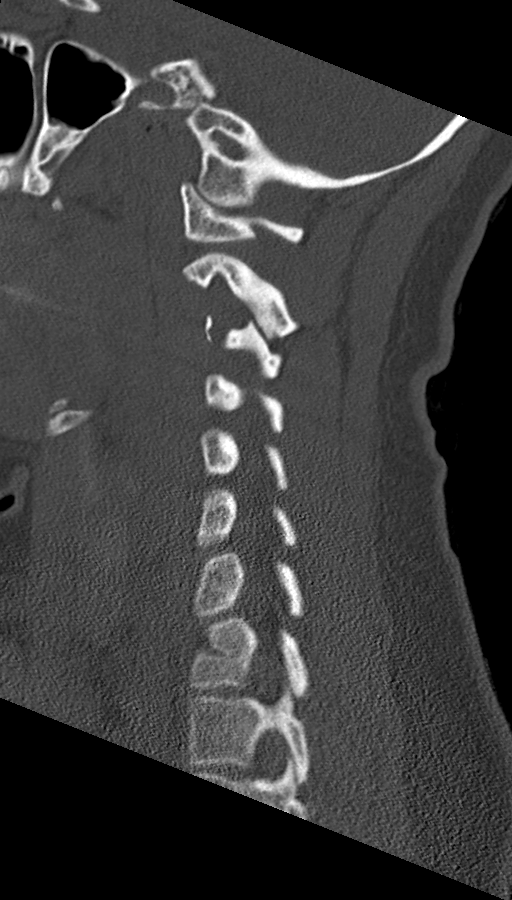
[im 41/61  bone]
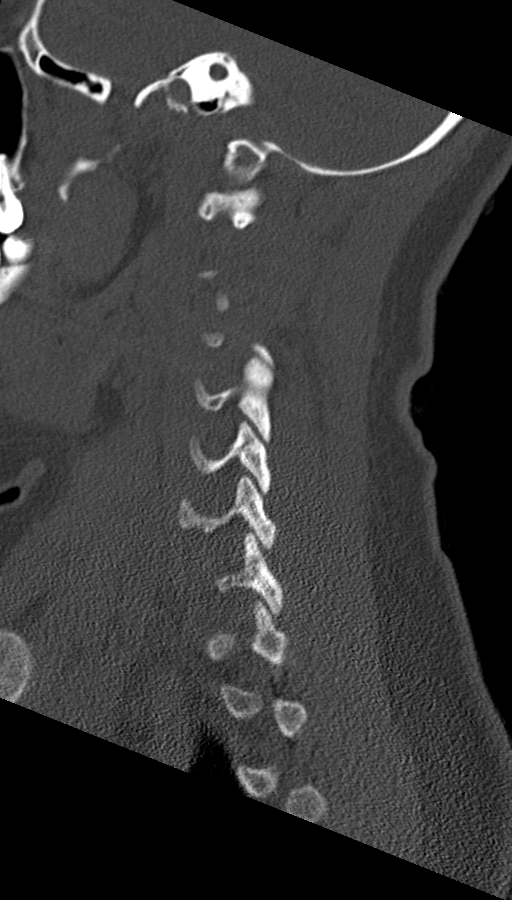

[Series 6: coronal bone · coronal · 0.23mm/px · 3 of 61 slices shown]
[im 13/61  bone]
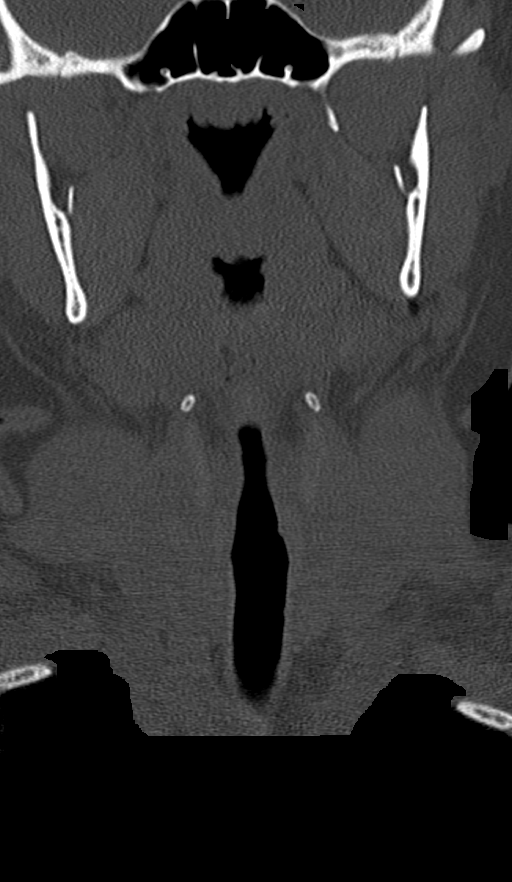
[im 25/61  bone]
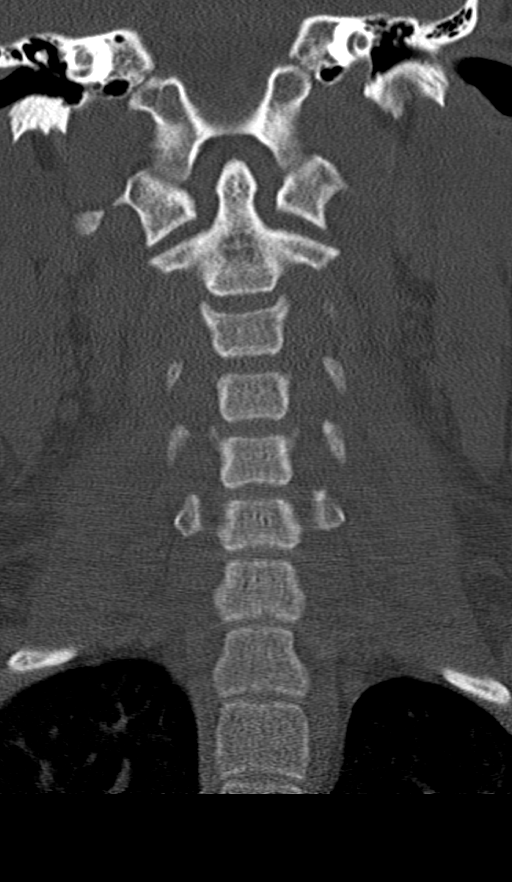
[im 37/61  bone]
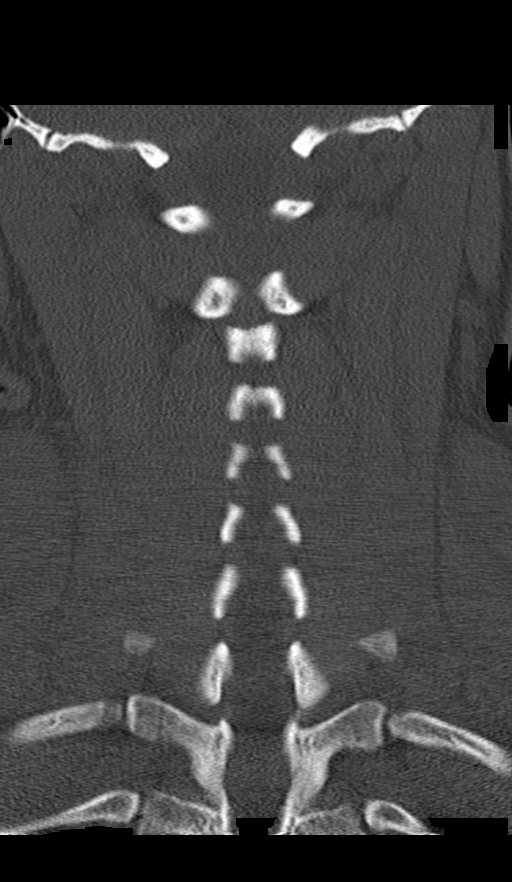

[Series 7: orthogonal axials · axial · 0.21mm/px · z∈[+1355,+1447]mm · 3 of 87 slices shown, 4 images]
[im 15/87  soft-tissue]
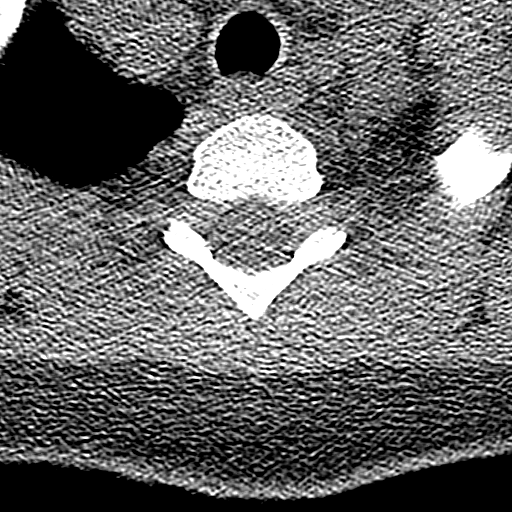
[im 15/87  bone]
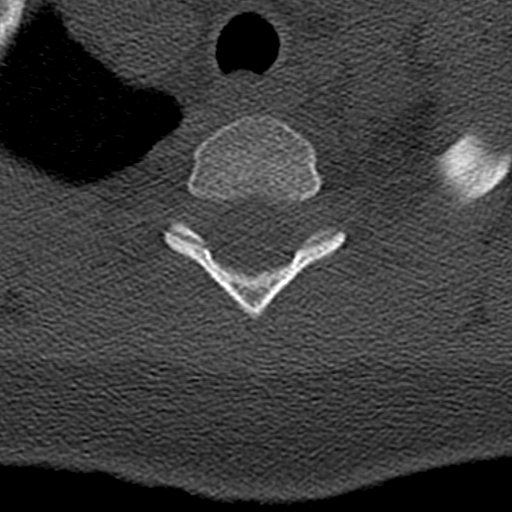
[im 44/87  bone]
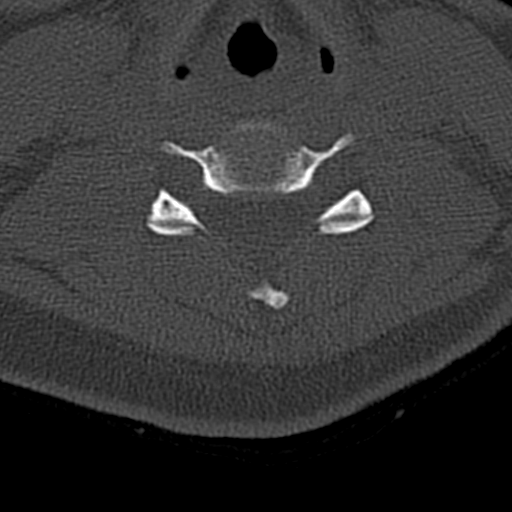
[im 72/87  bone]
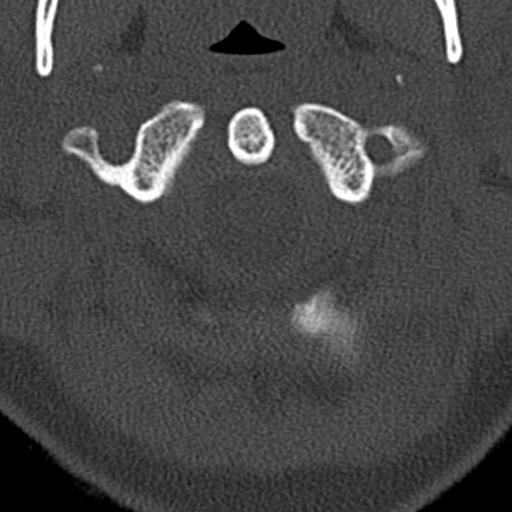

[11 of 33 positions shown; findings below may reference images not displayed]

FINDINGS: CT HEAD FINDINGS

Brain: No evidence of acute infarction, hemorrhage, hydrocephalus,
extra-axial collection or mass lesion/mass effect.

Vascular: No hyperdense vessel or unexpected calcification.

Skull: Normal. Negative for fracture or focal lesion.

Sinuses/Orbits: No acute finding.

Other: Small left anterior scalp hematoma.

CT CERVICAL SPINE FINDINGS

Alignment: Straightening of the cervical spine. No subluxation.
Facet alignment is normal.

Skull base and vertebrae: No acute fracture. No primary bone lesion
or focal pathologic process.

Soft tissues and spinal canal: No prevertebral fluid or swelling. No
visible canal hematoma.

Disc levels:  Within normal limits

Upper chest: Negative.

Other: None
IMPRESSION: 1. Negative non contrasted CT appearance of the brain.
2. Straightening of the cervical spine. No acute osseous
abnormality.

## 2023-12-09 ENCOUNTER — Other Ambulatory Visit: Payer: Self-pay

## 2023-12-09 ENCOUNTER — Emergency Department (HOSPITAL_COMMUNITY)
Admission: EM | Admit: 2023-12-09 | Discharge: 2023-12-09 | Disposition: A | Discharge status: Home / Self Care | Payer: Self-pay | Attending: Emergency Medicine | Admitting: Emergency Medicine

## 2023-12-09 ENCOUNTER — Encounter (HOSPITAL_COMMUNITY): Payer: Self-pay

## 2023-12-09 DIAGNOSIS — G40909 Epilepsy, unspecified, not intractable, without status epilepticus: Secondary | ICD-10-CM | POA: Insufficient documentation

## 2023-12-09 DIAGNOSIS — F445 Conversion disorder with seizures or convulsions: Secondary | ICD-10-CM | POA: Insufficient documentation

## 2023-12-09 DIAGNOSIS — F17210 Nicotine dependence, cigarettes, uncomplicated: Secondary | ICD-10-CM | POA: Insufficient documentation

## 2023-12-09 DIAGNOSIS — R000 Tachycardia, unspecified: Secondary | ICD-10-CM | POA: Insufficient documentation

## 2023-12-09 MED ORDER — LEVETIRACETAM (KEPPRA) 500 MG/5 ML ADULT IV PUSH
1500.0000 mg | Freq: Once | INTRAVENOUS | Status: AC
  Administered 2023-12-09: 1500 mg via INTRAVENOUS
  Filled 2023-12-09: qty 15

## 2023-12-09 MED ORDER — LACTATED RINGERS IV BOLUS
500.0000 mL | Freq: Once | INTRAVENOUS | Status: AC
  Administered 2023-12-09: 500 mL via INTRAVENOUS

## 2023-12-09 NOTE — Discharge Instructions (Addendum)
 You refused labs in the hospital. I think this is reasonable given your known history. Please ensure you are taking your prescribed medications. Your heart rate is a little fast likely related to the situation, ensure you are drinking plenty of fluids and have it rechecked in 24-48 hours.

## 2023-12-09 NOTE — ED Triage Notes (Signed)
 Pt BIB [facility] for seizure. EMS did not witness any seizures. Pt was being arrested and had a seizure during. Hx of seizures. Officers at bedside. Pt in handcuffs

## 2023-12-09 NOTE — ED Notes (Signed)
 Pt refused blood work

## 2023-12-09 NOTE — ED Provider Notes (Signed)
 Emergency Department Provider Note  TRIAGE NOTE: Pt BIB [facility] for seizure. EMS did not witness any seizures. Pt was being arrested and had a seizure during. Hx of seizures. Officers at bedside. Pt in handcuffs   HISTORY  Chief Complaint Seizures   HPI Brandi Sanchez is a 23 y.o. female with   a history of epilepsy and is currently taking Keppra  and Abilify. The patient reports adherence to Keppra  but admits to possibly missing one dose of Abilify. There is no recent history of illness, and the patient denies the use of alcohol or recreational drugs, although they do smoke marijuana. The patient has not consumed alcohol in years and occasionally uses Tylenol . The history was obtained from the patient.  Police noted that when they went to arrest her she suddenly started shaking. Did not fall to ground. Did not hit head but would not respond. Unsure how long it lasted. Per EMS, no postictal state with them.   Of note, patient does have a documented history of JME, however in 2023 when incarcerated she had multiple seizure like episodes and was admitted for EEG. Episodes happened on EEG and found to be non-epileptic and thus no changes at that time.   PMH Past Medical History:  Diagnosis Date   ADHD    Bipolar 1 disorder (HCC)    Seizures (HCC)    juvenile myoclonic epilepsy    Home Medications Prior to Admission medications   Medication Sig Start Date End Date Taking? Authorizing Provider  levETIRAcetam  (KEPPRA ) 1000 MG tablet Take 1 tablet (1,000 mg total) by mouth 2 (two) times daily. 11/14/21 12/14/21  DanfordLonni SQUIBB, MD    Social History Social History   Tobacco Use   Smoking status: Every Day    Current packs/day: 0.25    Types: Cigarettes   Smokeless tobacco: Never   Tobacco comments:    3 cigarettes a day  Vaping Use   Vaping status: Former  Substance Use Topics   Alcohol use: Not Currently   Drug use: Never    Review of Systems: Documented in  HPI ____________________________________________  PHYSICAL EXAM: VITAL SIGNS: Triage: Blood pressure 106/61, pulse (!) 128, temperature 99.8 F (37.7 C), temperature source Oral, resp. rate 17, weight 89.8 kg, SpO2 98%.  Vitals:   12/09/23 2259 12/09/23 2301 12/09/23 2305  BP:   106/61  Pulse: (!) 133  (!) 128  Resp: 18  17  Temp: 99.8 F (37.7 C)    TempSrc: Oral    SpO2: 98%  98%  Weight:  89.8 kg     Physical Exam Vitals and nursing note reviewed.  Constitutional:      Appearance: She is well-developed.     Comments: Lower pants are wet (raining outside) however no e/o incontinence  HENT:     Head: Normocephalic and atraumatic.     Comments: No tongue injuries Cardiovascular:     Rate and Rhythm: Regular rhythm. Tachycardia present.  Pulmonary:     Effort: No respiratory distress.     Breath sounds: No stridor.  Abdominal:     General: There is no distension.  Musculoskeletal:     Cervical back: Normal range of motion.  Neurological:     Mental Status: She is alert.       ____________________________________________   LABS (all labs ordered are listed, but only abnormal results are displayed)  Labs Reviewed  I-STAT CHEM 8, ED   ____________________________________________  EKG   EKG Interpretation Date/Time:  Ventricular Rate:    PR Interval:    QRS Duration:    QT Interval:    QTC Calculation:   R Axis:      Text Interpretation:          ____________________________________________  RADIOLOGY  No results found. ____________________________________________  PROCEDURES  Procedure(s) performed:   Procedures ____________________________________________  INITIAL IMPRESSION / ASSESSMENT AND PLAN   Clinical Course as of 12/09/23 2336  Sun Dec 09, 2023  2324 Initial Evaluation:  Patient presents with a history of epilepsy and concerns about a potential seizure episode. Plan:  Istat Chem8 to evaluate for metabolic imbalances or  other abnormalities. Administer an extra dose of Keppra  if the night dose has not been taken. Fluids for tachy Monitor for further seizure activity. Ensure medication compliance. Discharge if blood tests are normal and the patient is stable, with instructions to follow up with a neurologist. [JM]    Clinical Course User Index [JM] Shristi Scheib, Selinda, MD     Images ordered viewed and obtained by myself. Agree with Radiology interpretation. Details in ED course.  Labs ordered reviewed by myself as detailed in ED course.  Consultations obtained/considered detailed in ED course.    CRITICAL INTERVENTIONS:  N/a  Patient refused to have her labs drawn. I don't think this is entirely unreasonable as she has known history of JME and non-epileptic seizures as well. She is slightly tachycardic still and thus the reason for the labs and fluids however she is competent to make her own decisions so will be discharged to [employer] custody.    FINAL IMPRESSION Final diagnoses:  Seizure disorder [facility])  Psychogenic nonepileptic seizure     Disposition A medical screening exam was performed and I feel the patient has had an appropriate workup for their chief complaint at this time and likelihood of emergent condition existing is low. They have been counseled on decision, DISCHARGE, follow up and which symptoms necessitate immediate return to the emergency department. They or their family verbally stated understanding and agreement with plan and discharged in stable condition.   ____________________________________________   NEW OUTPATIENT MEDICATIONS STARTED DURING THIS VISIT:  New Prescriptions   No medications on file    Note:  This note was prepared with assistance of Dragon voice recognition software. Occasional wrong-word or sound-a-like substitutions may have occurred due to the inherent limitations of voice recognition software.    Lorette Selinda, MD 12/09/23 [phone]
